# Patient Record
Sex: Female | Born: 1965 | Hispanic: Yes | Smoking: Never smoker
Health system: Southern US, Community
[De-identification: ages and names within clinical notes are randomized; demographics above are authoritative.]

## PROBLEM LIST (undated history)

## (undated) DIAGNOSIS — I10 Essential (primary) hypertension: Secondary | ICD-10-CM

## (undated) DIAGNOSIS — T8859XA Other complications of anesthesia, initial encounter: Secondary | ICD-10-CM

## (undated) DIAGNOSIS — R112 Nausea with vomiting, unspecified: Secondary | ICD-10-CM

## (undated) DIAGNOSIS — G4730 Sleep apnea, unspecified: Secondary | ICD-10-CM

## (undated) DIAGNOSIS — G8929 Other chronic pain: Secondary | ICD-10-CM

## (undated) DIAGNOSIS — R519 Headache, unspecified: Secondary | ICD-10-CM

## (undated) DIAGNOSIS — F32A Depression, unspecified: Secondary | ICD-10-CM

## (undated) DIAGNOSIS — R51 Headache: Secondary | ICD-10-CM

## (undated) DIAGNOSIS — Z9889 Other specified postprocedural states: Secondary | ICD-10-CM

## (undated) DIAGNOSIS — T4145XA Adverse effect of unspecified anesthetic, initial encounter: Secondary | ICD-10-CM

## (undated) DIAGNOSIS — M25559 Pain in unspecified hip: Secondary | ICD-10-CM

## (undated) DIAGNOSIS — F329 Major depressive disorder, single episode, unspecified: Secondary | ICD-10-CM

## (undated) DIAGNOSIS — Z87898 Personal history of other specified conditions: Secondary | ICD-10-CM

## (undated) HISTORY — PX: NASAL SINUS SURGERY: SHX719

---

## 2004-04-13 ENCOUNTER — Ambulatory Visit: Payer: Self-pay | Admitting: General Practice

## 2006-07-25 ENCOUNTER — Ambulatory Visit: Payer: Self-pay | Admitting: Family Medicine

## 2007-02-07 ENCOUNTER — Ambulatory Visit: Payer: Self-pay | Admitting: Family Medicine

## 2007-02-26 ENCOUNTER — Encounter: Payer: Self-pay | Admitting: Family Medicine

## 2007-03-01 ENCOUNTER — Encounter: Payer: Self-pay | Admitting: Family Medicine

## 2007-10-16 ENCOUNTER — Ambulatory Visit: Payer: Self-pay | Admitting: Family Medicine

## 2008-05-16 ENCOUNTER — Ambulatory Visit: Payer: Self-pay | Admitting: Internal Medicine

## 2008-07-02 ENCOUNTER — Inpatient Hospital Stay: Payer: Self-pay | Admitting: Internal Medicine

## 2008-07-22 ENCOUNTER — Ambulatory Visit: Payer: Self-pay | Admitting: Internal Medicine

## 2008-08-07 ENCOUNTER — Ambulatory Visit: Payer: Self-pay | Admitting: Internal Medicine

## 2010-07-14 ENCOUNTER — Ambulatory Visit: Payer: Self-pay | Admitting: Internal Medicine

## 2010-08-12 ENCOUNTER — Ambulatory Visit: Payer: Self-pay | Admitting: Internal Medicine

## 2010-11-22 ENCOUNTER — Emergency Department: Payer: Self-pay | Admitting: *Deleted

## 2010-11-25 ENCOUNTER — Emergency Department: Payer: Self-pay | Admitting: Internal Medicine

## 2011-09-28 ENCOUNTER — Ambulatory Visit: Payer: Self-pay | Admitting: Internal Medicine

## 2011-10-04 ENCOUNTER — Encounter: Payer: Self-pay | Admitting: Physician Assistant

## 2011-10-30 ENCOUNTER — Encounter: Payer: Self-pay | Admitting: Physician Assistant

## 2011-11-29 ENCOUNTER — Encounter: Payer: Self-pay | Admitting: Physician Assistant

## 2012-01-03 ENCOUNTER — Ambulatory Visit: Payer: Self-pay | Admitting: Internal Medicine

## 2012-04-24 ENCOUNTER — Encounter: Payer: Self-pay | Admitting: Orthopedic Surgery

## 2012-04-28 ENCOUNTER — Encounter: Payer: Self-pay | Admitting: Orthopedic Surgery

## 2012-05-29 ENCOUNTER — Encounter: Payer: Self-pay | Admitting: Orthopedic Surgery

## 2012-07-01 ENCOUNTER — Emergency Department: Payer: Self-pay | Admitting: Emergency Medicine

## 2012-07-03 ENCOUNTER — Emergency Department: Payer: Self-pay | Admitting: Emergency Medicine

## 2012-07-03 LAB — CBC
HCT: 41.5 % (ref 35.0–47.0)
HGB: 14.1 g/dL (ref 12.0–16.0)
MCHC: 33.9 g/dL (ref 32.0–36.0)
MCV: 86 fL (ref 80–100)
Platelet: 225 10*3/uL (ref 150–440)
RBC: 4.82 10*6/uL (ref 3.80–5.20)
WBC: 9.3 10*3/uL (ref 3.6–11.0)

## 2012-07-03 LAB — BASIC METABOLIC PANEL
Anion Gap: 5 — ABNORMAL LOW (ref 7–16)
Calcium, Total: 9 mg/dL (ref 8.5–10.1)
Chloride: 109 mmol/L — ABNORMAL HIGH (ref 98–107)
Co2: 24 mmol/L (ref 21–32)
Glucose: 79 mg/dL (ref 65–99)
Potassium: 3.9 mmol/L (ref 3.5–5.1)
Sodium: 138 mmol/L (ref 136–145)

## 2012-11-07 ENCOUNTER — Ambulatory Visit: Payer: Self-pay | Admitting: Internal Medicine

## 2012-12-27 ENCOUNTER — Encounter: Payer: Self-pay | Admitting: Orthopedic Surgery

## 2012-12-29 ENCOUNTER — Encounter: Payer: Self-pay | Admitting: Orthopedic Surgery

## 2013-01-26 ENCOUNTER — Emergency Department: Payer: Self-pay | Admitting: Emergency Medicine

## 2013-01-28 ENCOUNTER — Encounter: Payer: Self-pay | Admitting: Orthopedic Surgery

## 2013-06-04 DIAGNOSIS — G44221 Chronic tension-type headache, intractable: Secondary | ICD-10-CM | POA: Insufficient documentation

## 2013-06-04 DIAGNOSIS — E782 Mixed hyperlipidemia: Secondary | ICD-10-CM | POA: Insufficient documentation

## 2013-06-04 DIAGNOSIS — N83209 Unspecified ovarian cyst, unspecified side: Secondary | ICD-10-CM | POA: Insufficient documentation

## 2013-06-04 DIAGNOSIS — I10 Essential (primary) hypertension: Secondary | ICD-10-CM | POA: Insufficient documentation

## 2013-07-30 DIAGNOSIS — M169 Osteoarthritis of hip, unspecified: Secondary | ICD-10-CM | POA: Insufficient documentation

## 2013-08-09 DIAGNOSIS — D649 Anemia, unspecified: Secondary | ICD-10-CM | POA: Insufficient documentation

## 2013-11-08 ENCOUNTER — Ambulatory Visit: Payer: Self-pay | Admitting: Obstetrics and Gynecology

## 2013-11-13 DIAGNOSIS — F32A Depression, unspecified: Secondary | ICD-10-CM | POA: Insufficient documentation

## 2013-11-27 ENCOUNTER — Ambulatory Visit: Payer: Self-pay | Admitting: Neurology

## 2014-01-03 ENCOUNTER — Ambulatory Visit: Payer: Self-pay | Admitting: Orthopedic Surgery

## 2014-02-05 DIAGNOSIS — M48061 Spinal stenosis, lumbar region without neurogenic claudication: Secondary | ICD-10-CM | POA: Insufficient documentation

## 2014-02-05 DIAGNOSIS — M48062 Spinal stenosis, lumbar region with neurogenic claudication: Secondary | ICD-10-CM | POA: Insufficient documentation

## 2014-04-11 DIAGNOSIS — M5416 Radiculopathy, lumbar region: Secondary | ICD-10-CM | POA: Insufficient documentation

## 2014-07-09 ENCOUNTER — Emergency Department
Admission: EM | Admit: 2014-07-09 | Discharge: 2014-07-09 | Disposition: A | Discharge status: Home / Self Care | Payer: Commercial Managed Care - PPO | Attending: Emergency Medicine | Admitting: Emergency Medicine

## 2014-07-09 ENCOUNTER — Emergency Department: Payer: Commercial Managed Care - PPO

## 2014-07-09 DIAGNOSIS — Z3202 Encounter for pregnancy test, result negative: Secondary | ICD-10-CM | POA: Insufficient documentation

## 2014-07-09 DIAGNOSIS — N390 Urinary tract infection, site not specified: Secondary | ICD-10-CM | POA: Insufficient documentation

## 2014-07-09 DIAGNOSIS — Z79899 Other long term (current) drug therapy: Secondary | ICD-10-CM | POA: Diagnosis not present

## 2014-07-09 DIAGNOSIS — R1032 Left lower quadrant pain: Secondary | ICD-10-CM | POA: Diagnosis present

## 2014-07-09 DIAGNOSIS — R110 Nausea: Secondary | ICD-10-CM | POA: Diagnosis not present

## 2014-07-09 HISTORY — DX: Depression, unspecified: F32.A

## 2014-07-09 HISTORY — DX: Headache, unspecified: R51.9

## 2014-07-09 HISTORY — DX: Pain in unspecified hip: M25.559

## 2014-07-09 HISTORY — DX: Other chronic pain: G89.29

## 2014-07-09 HISTORY — DX: Headache: R51

## 2014-07-09 HISTORY — DX: Major depressive disorder, single episode, unspecified: F32.9

## 2014-07-09 LAB — BASIC METABOLIC PANEL
Anion gap: 7 (ref 5–15)
BUN: 12 mg/dL (ref 6–20)
CHLORIDE: 105 mmol/L (ref 101–111)
CO2: 27 mmol/L (ref 22–32)
CREATININE: 0.41 mg/dL — AB (ref 0.44–1.00)
Calcium: 8.9 mg/dL (ref 8.9–10.3)
GFR calc non Af Amer: >60 mL/min (ref >60)
GLUCOSE: 116 mg/dL — AB (ref 65–99)
Potassium: 3.6 mmol/L (ref 3.5–5.1)
Sodium: 139 mmol/L (ref 135–145)

## 2014-07-09 LAB — CBC WITH DIFFERENTIAL/PLATELET
Basophils Absolute: 0 10*3/uL (ref 0–0.1)
Basophils Relative: 0 %
EOS ABS: 0 10*3/uL (ref 0–0.7)
EOS PCT: 0 %
HEMATOCRIT: 42.4 % (ref 35.0–47.0)
Hemoglobin: 14.2 g/dL (ref 12.0–16.0)
LYMPHS PCT: 11 %
Lymphs Abs: 1.2 10*3/uL (ref 1.0–3.6)
MCH: 31.8 pg (ref 26.0–34.0)
MCHC: 33.5 g/dL (ref 32.0–36.0)
MCV: 95 fL (ref 80.0–100.0)
MONOS PCT: 7 %
Monocytes Absolute: 0.8 10*3/uL (ref 0.2–0.9)
Neutro Abs: 9.1 10*3/uL — ABNORMAL HIGH (ref 1.4–6.5)
Neutrophils Relative %: 82 %
PLATELETS: 210 10*3/uL (ref 150–440)
RBC: 4.47 MIL/uL (ref 3.80–5.20)
RDW: 13.1 % (ref 11.5–14.5)
WBC: 11.2 10*3/uL — ABNORMAL HIGH (ref 3.6–11.0)

## 2014-07-09 LAB — URINALYSIS COMPLETE WITH MICROSCOPIC (ARMC ONLY)
BACTERIA UA: NONE SEEN
BILIRUBIN URINE: NEGATIVE
GLUCOSE, UA: NEGATIVE mg/dL
Hgb urine dipstick: NEGATIVE
Ketones, ur: NEGATIVE mg/dL
NITRITE: NEGATIVE
Protein, ur: 30 mg/dL — AB
SPECIFIC GRAVITY, URINE: 1.024 (ref 1.005–1.030)
pH: 5 (ref 5.0–8.0)

## 2014-07-09 LAB — PREGNANCY, URINE: PREG TEST UR: NEGATIVE

## 2014-07-09 LAB — LIPASE, BLOOD: Lipase: 23 U/L (ref 22–51)

## 2014-07-09 MED ORDER — OXYCODONE-ACETAMINOPHEN 5-325 MG PO TABS: 2.0000 | ORAL_TABLET | Freq: Once | ORAL | Status: DC

## 2014-07-09 MED ORDER — CIPROFLOXACIN HCL 500 MG PO TABS: 500.0000 mg | ORAL_TABLET | Freq: Two times a day (BID) | ORAL | Status: AC

## 2014-07-09 MED ORDER — OXYCODONE-ACETAMINOPHEN 5-325 MG PO TABS
ORAL_TABLET | ORAL | Status: AC
  Filled 2014-07-09: qty 2

## 2014-07-09 MED ORDER — OXYCODONE-ACETAMINOPHEN 5-325 MG PO TABS: 1.0000 | ORAL_TABLET | Freq: Four times a day (QID) | ORAL | Status: DC | PRN

## 2014-07-09 NOTE — Discharge Instructions (Signed)
Dolor abdominal (Abdominal Pain) El dolor puede tener muchas causas. Normalmente la causa del dolor abdominal no es una enfermedad y Scientist, clinical (histocompatibility and immunogenetics)mejorar sin TEFL teachertratamiento. Frecuentemente puede controlarse y tratarse en casa. Su mdico le Medical sales representativerealizar un examen fsico y posiblemente solicite anlisis de sangre y radiografas para ayudar a Chief Strategy Officerdeterminar la gravedad de su dolor. Sin embargo, en IAC/InterActiveCorpmuchos casos, debe transcurrir ms tiempo antes de que se pueda Clinical research associateencontrar una causa evidente del dolor. Antes de llegar a ese punto, es posible que su mdico no sepa si necesita ms pruebas o un tratamiento ms profundo. INSTRUCCIONES PARA EL CUIDADO EN EL HOGAR  Est atento al dolor para ver si hay cambios. Las siguientes indicaciones ayudarn a Architectural technologistaliviar cualquier molestia que pueda sentir:  Stovallome solo medicamentos de venta libre o recetados, segn las indicaciones del mdico.  No tome laxantes a menos que se lo haya indicado su mdico.  Pruebe con Neomia Dearuna dieta lquida absoluta (caldo, t o agua) segn se lo indique su mdico. Introduzca gradualmente una dieta normal, segn su tolerancia. SOLICITE ATENCIN MDICA SI:  Tiene dolor abdominal sin explicacin.  Tiene dolor abdominal relacionado con nuseas o diarrea.  Tiene dolor cuando orina o defeca.  Experimenta dolor abdominal que lo despierta de noche.  Tiene dolor abdominal que empeora o mejora cuando come alimentos.  Tiene dolor abdominal que empeora cuando come alimentos grasosos.  Tiene fiebre. SOLICITE ATENCIN MDICA DE INMEDIATO SI:   El dolor no desaparece en un plazo mximo de 2horas.  No deja de (vomitar).  El Engineer, miningdolor se siente solo en partes del abdomen, como el lado derecho o la parte inferior izquierda del abdomen.  Evaca materia fecal sanguinolenta o negra, de aspecto alquitranado. ASEGRESE DE QUE:  Comprende estas instrucciones.  Controlar su afeccin.  Recibir ayuda de inmediato si no mejora o si empeora. Document Released: 02/14/2005  Document Revised: 02/19/2013 [facility] Patient Information 2015 EbonyExitCare, MarylandLLC. This information is not intended to replace advice given to you by your health care provider. Make sure you discuss any questions you have with your health care provider.  Infeccin urinaria  (Urinary Tract Infection)  La infeccin urinaria puede ocurrir en Corporate treasurercualquier lugar del tracto urinario. El tracto urinario es un sistema de drenaje del cuerpo por el que se eliminan los desechos y el exceso de [place]. El tracto urinario est formado por dos riones, dos urteres, la vejiga y Engineer, miningla uretra. Los riones son rganos que tienen forma de frijol. Cada rin tiene aproximadamente el tamao del puo. Estn situados debajo de las Castlewoodcostillas, uno a cada lado de la columna vertebral CAUSAS  La causa de la infeccin son los microbios, que son organismos microscpicos, que incluyen hongos, virus, y bacterias. Estos organismos son tan pequeos que slo pueden verse a travs del microscopio. Las bacterias son los microorganismos que ms comnmente causan infecciones urinarias.  SNTOMAS  Los sntomas pueden variar segn la edad y el sexo del paciente y por la ubicacin de la infeccin. Los sntomas en las mujeres jvenes incluyen la necesidad frecuente e intensa de orinar y una sensacin dolorosa de ardor en la vejiga o en la uretra durante la miccin. Las mujeres y los hombres mayores podrn sentir cansancio, temblores y debilidad y Futures tradersentir dolores musculares y Engineer, miningdolor abdominal. Si tiene Pie Townfiebre, puede significar que la infeccin est en los riones. Otros sntomas son dolor en la espalda o en los lados debajo de las [place] Hillcostillas, nuseas y vmitos.  DIAGNSTICO  Para diagnosticar una infeccin urinaria, el mdico Chief Executive Officerle preguntar acerca de  sus sntomas. Genuine Partsambin le solicitar una Conversemuestra de Comor[place]orina. La muestra de orina se analiza para Engineer, manufacturingdetectar bacterias y glbulos blancos de Risk managerla sangre. Los glbulos blancos se forman en el organismo para ayudar a Artistcombatir  las infecciones.  TRATAMIENTO  Por lo general, las infecciones urinarias pueden tratarse con medicamentos. Debido a que la Harley-Davidsonmayora de las infecciones son causadas por bacterias, por lo general pueden tratarse con antibiticos. La eleccin del antibitico y la duracin del tratamiento depender de sus sntomas y el tipo de bacteria causante de la infeccin.  INSTRUCCIONES PARA EL CUIDADO EN EL HOGAR   Si le recetaron antibiticos, tmelos exactamente como su mdico le indique. Termine el medicamento aunque se sienta mejor despus de haber tomado slo algunos.  Beba gran cantidad de lquido para mantener la orina de tono claro o color amarillo plido.  Evite la cafena, el t y las [address] Hospital Placebebidas gaseosas. Estas sustancias irritan la vejiga.  Vaciar la vejiga con frecuencia. Evite retener la orina durante largos perodos.  Vace la vejiga antes y despus de Management consultanttener relaciones sexuales.  Despus de mover el intestino, las mujeres deben higienizarse la regin perineal desde adelante hacia atrs. Use slo un papel tissue por vez. SOLICITE ATENCIN MDICA SI:   Siente dolor en la espalda.  Le sube la fiebre.  Los sntomas no mejoran luego de [address] das. SOLICITE ATENCIN MDICA DE INMEDIATO SI:   Siente dolor intenso en la espalda o en la zona inferior del abdomen.  Comienza a sentir escalofros.  Tiene nuseas o vmitos.  Tiene una sensacin continua de quemazn o molestias al ConocoPhillipsorinar. ASEGRESE DE QUE:   Comprende estas instrucciones.  Controlar su enfermedad.  Solicitar ayuda de inmediato si no mejora o empeora. Document Released: 11/24/2004 Document Revised: 11/09/2011 [facility]Exit[facility] Patient Information 2015 LutherExitCare, MarylandLLC. This information is not intended to replace advice given to you by your health care provider. Make sure you discuss any questions you have with your health care provider.

## 2014-07-09 NOTE — ED Provider Notes (Signed)
[facility] Emergency Department Provider Note    Time seen: 1640  I have reviewed the triage vital signs and the nursing notes.   HISTORY  Chief Complaint Abdominal Pain    HPI Makayla SpeedLeticia Matias Baker is a 49 y.o. female presents to ER for a pinching and dull left lower quadrant abdominal pain since yesterday. his medic make it better or worse, she has some nausea but has been going to bathroom normally does not have any dysuria. Denies any history of same. Pain is currently mild     Past Medical History  Diagnosis Date  . Hip pain, chronic   . Headache   . Depression     There are no active problems to display for this patient.   History reviewed. No pertinent past surgical history.  Current Outpatient Rx  Name  Route  Sig  Dispense  Refill  . Ascorbic Acid (VITAMIN C) 1000 MG tablet   Oral   Take 1,000 mg by mouth daily.         . ergocalciferol (VITAMIN D2) 50000 UNITS capsule   Oral   Take 50,000 Units by mouth once a week.         . ferrous fumarate (HEMOCYTE - 106 MG FE) 325 (106 FE) MG TABS tablet   Oral   Take 1 tablet by mouth.           Allergies Review of patient's allergies indicates no known allergies.  No family history on file.  Social History History  Substance Use Topics  . Smoking status: Never Smoker   . Smokeless tobacco: Not on file  . Alcohol Use: No    Review of Systems Constitutional: Negative for fever. Eyes: Negative for visual changes. ENT: Negative for sore throat. Cardiovascular: Negative for chest pain. Respiratory: Negative for shortness of breath. Gastrointestinal: Negative for abdominal pain, vomiting and diarrhea. Genitourinary: Negative for dysuria. Musculoskeletal: Negative for back pain. Skin: Negative for rash. Neurological: Negative for headaches, focal weakness or numbness.  10-point ROS otherwise negative.  ____________________________________________   PHYSICAL  EXAM:  VITAL SIGNS: ED Triage Vitals  Enc Vitals Group     BP 07/09/14 1405 136/72 mmHg     Pulse Rate 07/09/14 1405 92     Resp 07/09/14 1405 20     Temp 07/09/14 1405 98.5 F (36.9 C)     Temp Source 07/09/14 1405 Oral     SpO2 07/09/14 1405 98 %     Weight --      Height --      Head Cir --      Peak Flow --      Pain Score 07/09/14 1627 8     Pain Loc --      Pain Edu? --      Excl. in GC? --     Constitutional: Alert and oriented. Well appearing and in no distress. Eyes: Conjunctivae are normal. PERRL. Normal extraocular movements. ENT   Head: Normocephalic and atraumatic.   Nose: No congestion/rhinnorhea.   Mouth/Throat: Mucous membranes are moist.   Neck: No stridor. Hematological/Lymphatic/Immunilogical: No cervical lymphadenopathy. Cardiovascular: Normal rate, regular rhythm. Normal and symmetric distal pulses are present in all extremities. No murmurs, rubs, or gallops. Respiratory: Normal respiratory effort without tachypnea nor retractions. Breath sounds are clear and equal bilaterally. No wheezes/rales/rhonchi. Gastrointestinal: Soft and nontender. No distention. No abdominal bruits. There is no CVA tenderness. Musculoskeletal: Nontender with normal range of motion in all extremities. No joint effusions.  No lower extremity tenderness nor edema. Neurologic:  Normal speech and language. No gross focal neurologic deficits are appreciated. Speech is normal. No gait instability. Skin:  Skin is warm, dry and intact. No rash noted. Psychiatric: Mood and affect are normal. Speech and behavior are normal. Patient exhibits appropriate insight and judgment.  ____________________________________________    LABS (pertinent positives/negatives)  Labs Reviewed  BASIC METABOLIC PANEL - Abnormal; Notable for the following:    Glucose, Bld 116 (*)    Creatinine, Ser 0.41 (*)    All other components within normal limits  CBC WITH DIFFERENTIAL/PLATELET -  Abnormal; Notable for the following:    WBC 11.2 (*)    Neutro Abs 9.1 (*)    All other components within normal limits  URINALYSIS COMPLETEWITH MICROSCOPIC ([facility])  - Abnormal; Notable for the following:    Color, Urine AMBER (*)    APPearance HAZY (*)    Protein, ur 30 (*)    Leukocytes, UA TRACE (*)    Squamous Epithelial / LPF 0-5 (*)    All other components within normal limits  LIPASE, BLOOD  PREGNANCY, URINE     ____________________________________________    RADIOLOGY  ED and pelvis with right intrarenal stone but normal on the left.  ____________________________________________    ED COURSE  Pertinent labs & imaging results that were available during my care of the patient were reviewed by me and considered in my medical decision making (see chart for details).  We'll get basic labs and CT and reevaluate.  FINAL ASSESSMENT AND PLAN  Left-sided abdominal pain and cystitis  Plan: Patient discharged with antibiotics and pain medicine and close follow-up with her doctor.    Emily FilbertWilliams, [name], MD   [name], MD 07/09/14 [phone]

## 2014-07-09 NOTE — ED Notes (Signed)
LLQ pain and nausea since yesterday

## 2014-07-09 NOTE — ED Notes (Signed)
Pt complains of LLQ.  PT alert and oriented. Pt requesting food.

## 2014-07-15 ENCOUNTER — Other Ambulatory Visit: Payer: Self-pay | Admitting: Internal Medicine

## 2014-07-15 DIAGNOSIS — R102 Pelvic and perineal pain: Secondary | ICD-10-CM

## 2014-07-18 ENCOUNTER — Other Ambulatory Visit: Payer: Self-pay | Admitting: Internal Medicine

## 2014-07-18 DIAGNOSIS — R102 Pelvic and perineal pain: Secondary | ICD-10-CM

## 2014-07-21 ENCOUNTER — Ambulatory Visit
Admission: RE | Admit: 2014-07-21 | Discharge: 2014-07-21 | Disposition: A | Discharge status: Home / Self Care | Payer: Commercial Managed Care - PPO | Source: Ambulatory Visit | Attending: Internal Medicine | Admitting: Internal Medicine

## 2014-07-21 DIAGNOSIS — R102 Pelvic and perineal pain: Secondary | ICD-10-CM | POA: Diagnosis present

## 2014-11-26 ENCOUNTER — Other Ambulatory Visit: Payer: Self-pay | Admitting: Internal Medicine

## 2014-11-26 DIAGNOSIS — Z1231 Encounter for screening mammogram for malignant neoplasm of breast: Secondary | ICD-10-CM

## 2014-12-09 ENCOUNTER — Ambulatory Visit
Admission: RE | Admit: 2014-12-09 | Discharge: 2014-12-09 | Disposition: A | Discharge status: Home / Self Care | Payer: Commercial Managed Care - PPO | Source: Ambulatory Visit | Attending: Internal Medicine | Admitting: Internal Medicine

## 2014-12-09 DIAGNOSIS — Z1231 Encounter for screening mammogram for malignant neoplasm of breast: Secondary | ICD-10-CM | POA: Diagnosis present

## 2015-09-11 ENCOUNTER — Other Ambulatory Visit: Payer: Self-pay | Admitting: Obstetrics and Gynecology

## 2015-09-11 DIAGNOSIS — Z1231 Encounter for screening mammogram for malignant neoplasm of breast: Secondary | ICD-10-CM

## 2015-09-28 ENCOUNTER — Ambulatory Visit: Payer: Commercial Managed Care - PPO | Attending: Obstetrics and Gynecology

## 2015-10-26 DIAGNOSIS — R195 Other fecal abnormalities: Secondary | ICD-10-CM | POA: Insufficient documentation

## 2015-12-10 ENCOUNTER — Other Ambulatory Visit: Payer: Self-pay | Admitting: Obstetrics and Gynecology

## 2015-12-10 ENCOUNTER — Ambulatory Visit
Admission: RE | Admit: 2015-12-10 | Discharge: 2015-12-10 | Disposition: A | Discharge status: Home / Self Care | Payer: Commercial Managed Care - PPO | Source: Ambulatory Visit | Attending: Obstetrics and Gynecology | Admitting: Obstetrics and Gynecology

## 2015-12-10 DIAGNOSIS — Z1231 Encounter for screening mammogram for malignant neoplasm of breast: Secondary | ICD-10-CM | POA: Insufficient documentation

## 2016-05-21 IMAGING — MR MRI HEAD WITHOUT CONTRAST
10 series · 41 of 48 positions shown · non-contrast
Comparison: Mandible radiographs 01/26/2013. Head CT without
contrast 01/03/2012.

CLINICAL DATA: 48-year-old female with headache on the right side
of the face and jaw. Initial encounter. Dislocated jaw in December 2012.

EXAM:
MRI HEAD WITHOUT CONTRAST
TECHNIQUE: Multiplanar, multiecho pulse sequences of the brain and surrounding
structures were obtained without intravenous contrast.

[Series 3: T1 · sagittal · 5.0mm · 0.45mm/px · 3 of 27 slices shown (1 of 2)]
[im 1/27]
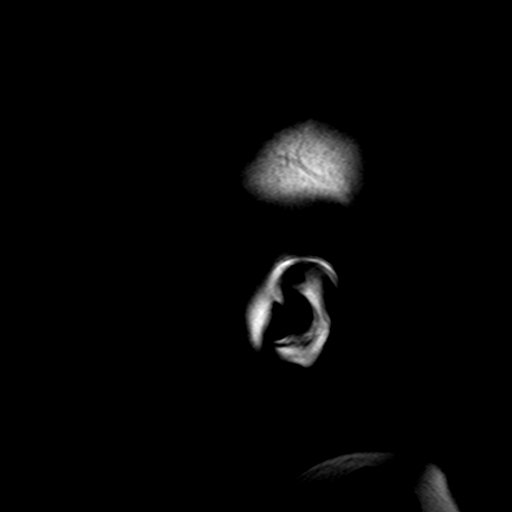
[im 14/27]
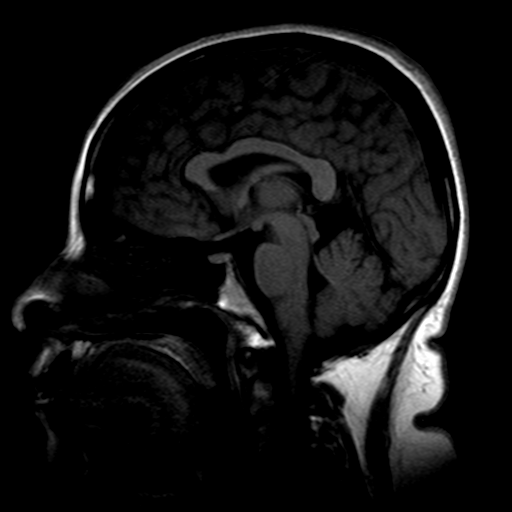
[im 27/27]
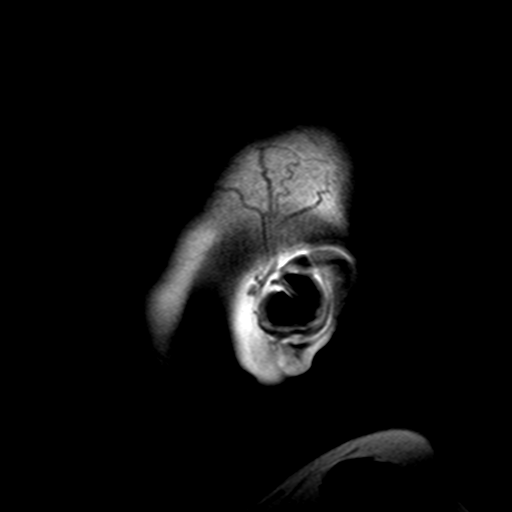

[Series 5: DWI · axial · 5.0mm · 1.80mm/px · z∈[-52,+111]mm · 4 of 26 slices shown (1 of 4)]
[im 1/26]
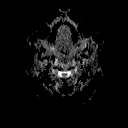
[im 9/26]
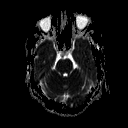
[im 17/26]
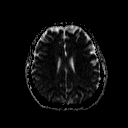
[im 26/26]
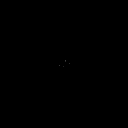

[Series 7: DWI · coronal · 5.0mm · 1.80mm/px · 6 of 37 slices shown (2 of 4)]
[im 1/37]
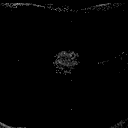
[im 8/37]
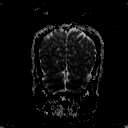
[im 15/37]
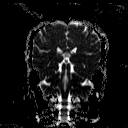
[im 22/37]
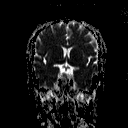
[im 29/37]
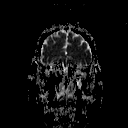
[im 37/37]
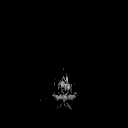

[Series 8: T2 · axial · 5.0mm · 0.45mm/px · z∈[-53,+110]mm · 4 of 26 slices shown (1 of 3)]
[im 1/26]
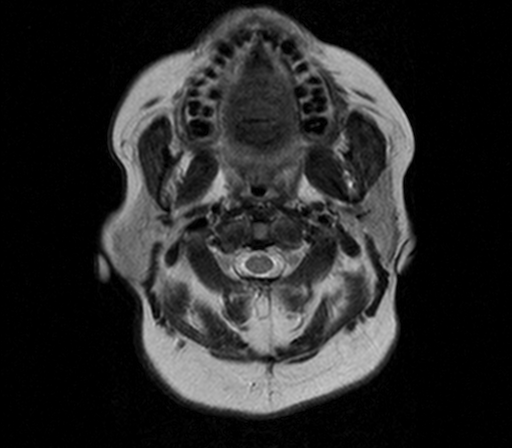
[im 9/26]
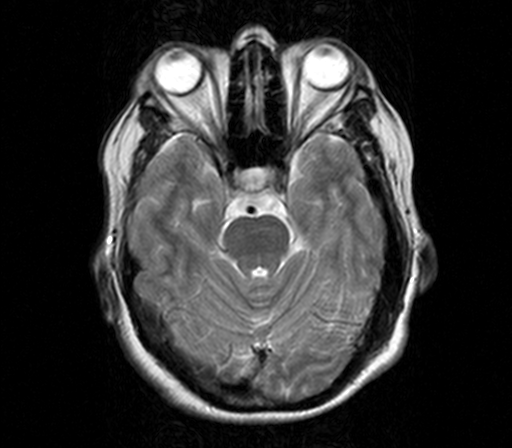
[im 17/26]
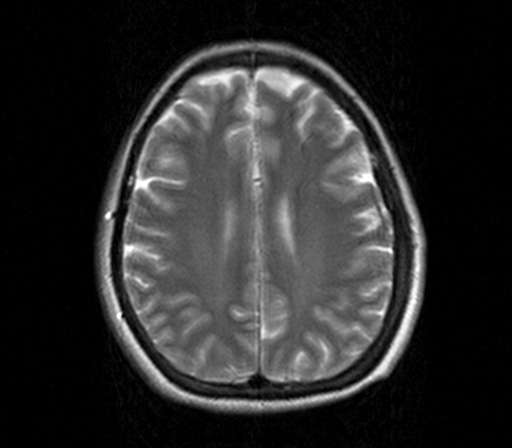
[im 26/26]
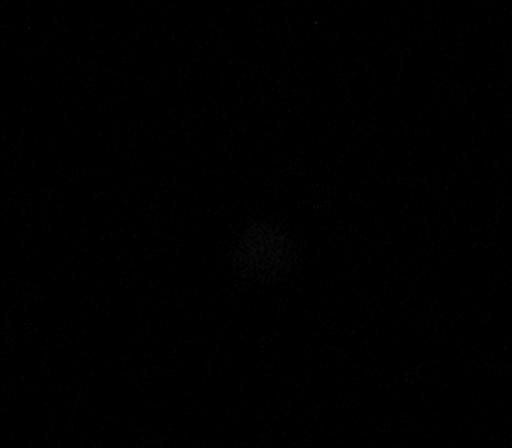

[Series 9: FLAIR · axial · 5.0mm · 0.90mm/px · z∈[-52,+110]mm · 4 of 26 slices shown]
[im 1/26]
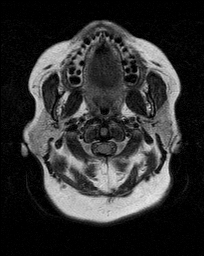
[im 9/26]
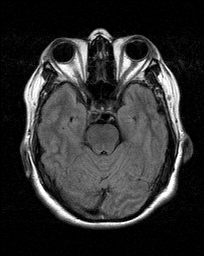
[im 17/26]
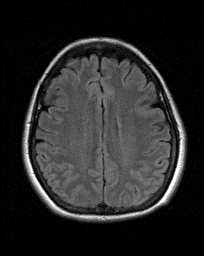
[im 26/26]
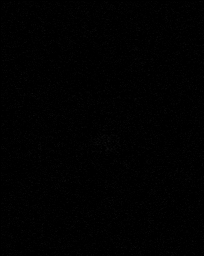

[Series 10: DWI · axial · 5.0mm · 1.80mm/px · z∈[-52,+104]mm · 4 of 25 slices shown (3 of 4)]
[im 1/25]
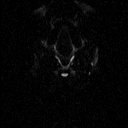
[im 9/25]
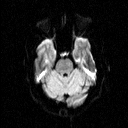
[im 17/25]
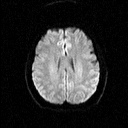
[im 25/25]
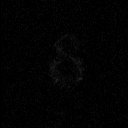

[Series 11: DWI · coronal · 5.0mm · 1.80mm/px · 5 of 36 slices shown (4 of 4)]
[im 1/36]
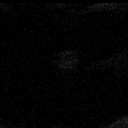
[im 9/36]
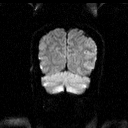
[im 18/36]
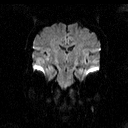
[im 27/36]
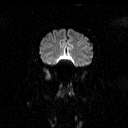
[im 36/36]
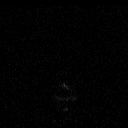

[Series 12: T2 · axial · 5.0mm · 0.45mm/px · z∈[-52,+110]mm · 4 of 26 slices shown (2 of 3)]
[im 1/26]
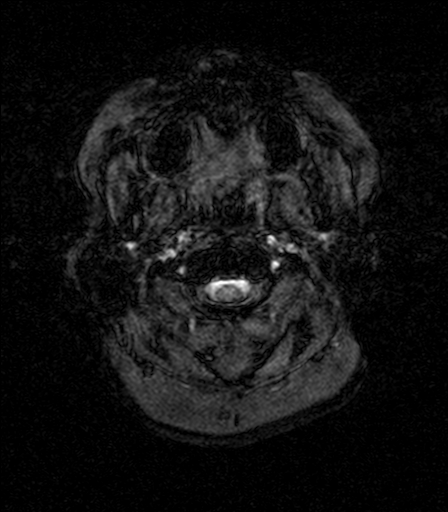
[im 9/26]
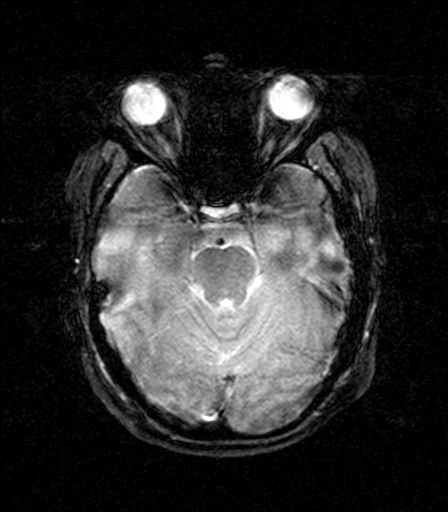
[im 17/26]
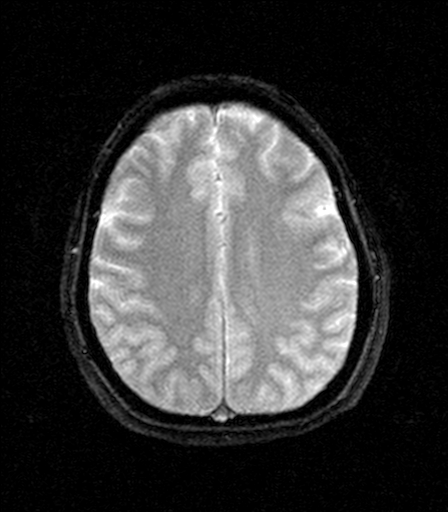
[im 26/26]
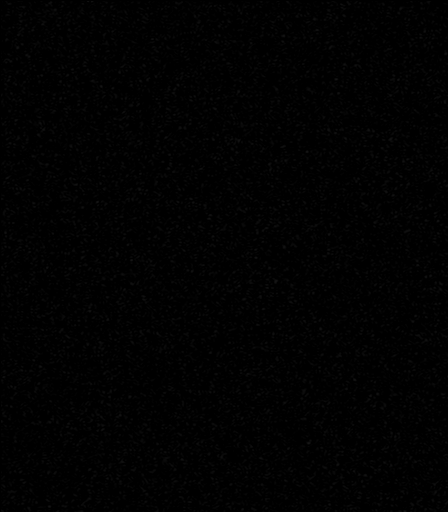

[Series 13: T1 · axial · 3.0mm · 0.45mm/px · 1 of 56 slices shown (2 of 2)]
[im 1/56]
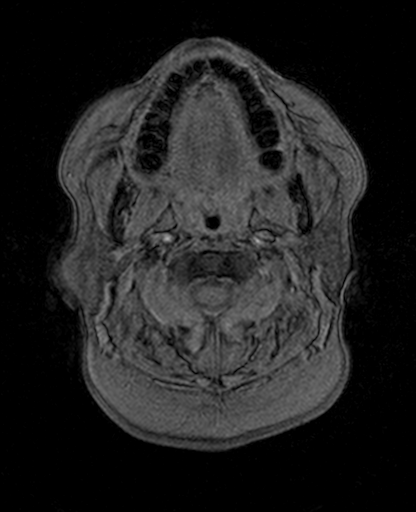

[Series 14: T2 · coronal · 5.0mm · 0.45mm/px · 6 of 37 slices shown (3 of 3)]
[im 1/37]
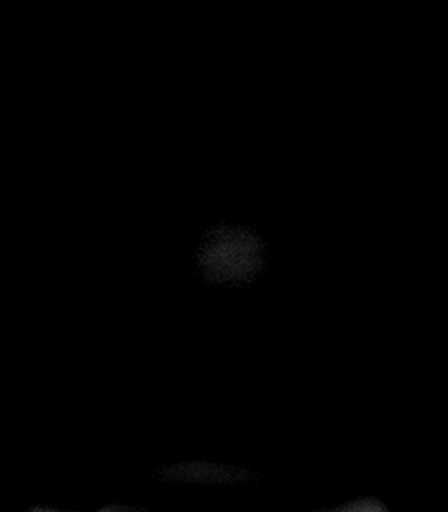
[im 8/37]
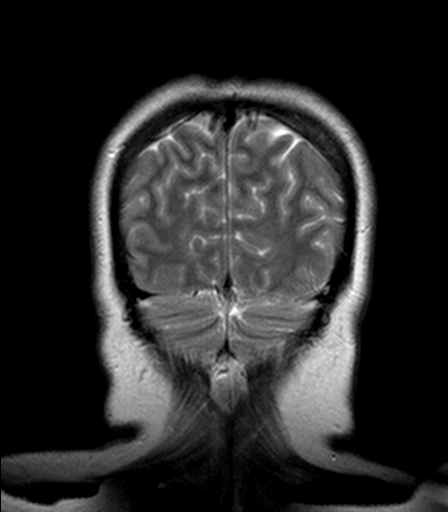
[im 15/37]
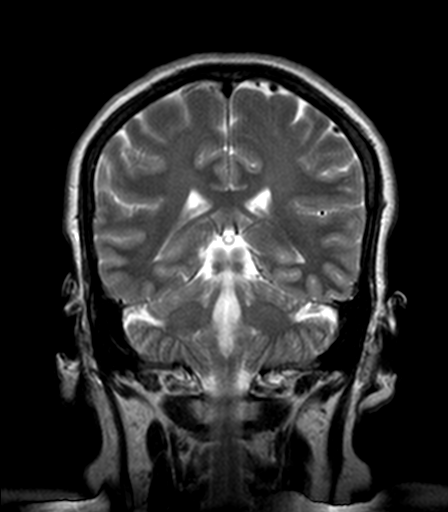
[im 22/37]
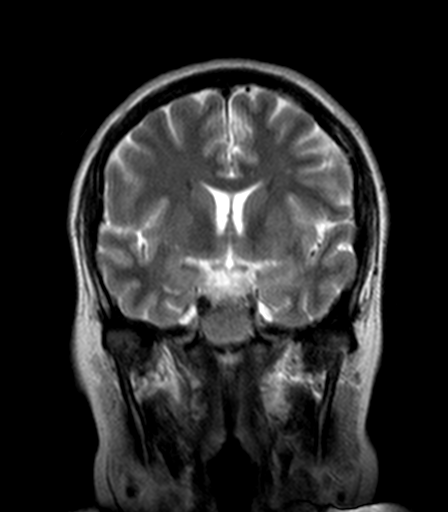
[im 29/37]
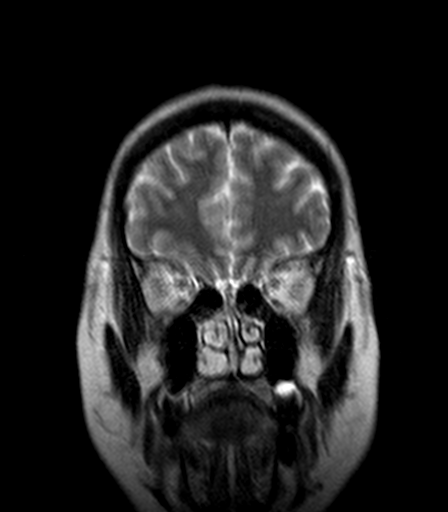
[im 37/37]
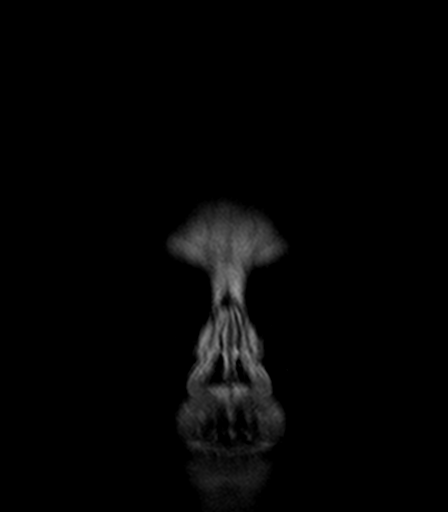

[41 of 48 positions shown; findings below may reference images not displayed]

FINDINGS: Cerebral volume is normal. No restricted diffusion to suggest acute
infarction. No midline shift, mass effect, evidence of mass lesion,
ventriculomegaly, extra-axial collection or acute intracranial
hemorrhage. Cervicomedullary junction and pituitary are within
normal limits. Negative visualized cervical spine. Major
intracranial vascular flow voids are preserved. Gray and white
matter signal is within normal limits throughout the brain.

Normal non contrast appearance of the cavernous sinus. Visualized
orbit soft tissues are within normal limits. Visualized paranasal
sinuses and mastoids are clear (aside from minimal left maxillary
alveolar recess mucosal thickening). No skullbase mass. Non contrast
V2 and V3 trunks appear within normal limits. Visible internal
auditory structures appear normal. Face and scalp soft tissues are
within normal limits. Visualized bone marrow signal is within normal
limits. Both TMJs appear normally located.
IMPRESSION: Normal non contrast MRI appearance of the brain.

No finding to explain right face and jaw pain; the right TMJ is
normally located, and skullbase and face soft tissues appear
unremarkable.

## 2016-09-12 ENCOUNTER — Other Ambulatory Visit: Payer: Self-pay | Admitting: Obstetrics and Gynecology

## 2016-09-28 ENCOUNTER — Other Ambulatory Visit: Payer: Self-pay | Admitting: Obstetrics and Gynecology

## 2016-09-28 DIAGNOSIS — Z1239 Encounter for other screening for malignant neoplasm of breast: Secondary | ICD-10-CM

## 2016-09-29 ENCOUNTER — Ambulatory Visit: Payer: Commercial Managed Care - PPO | Attending: Orthopedic Surgery

## 2016-09-29 DIAGNOSIS — G4733 Obstructive sleep apnea (adult) (pediatric): Secondary | ICD-10-CM | POA: Insufficient documentation

## 2016-09-29 DIAGNOSIS — G4761 Periodic limb movement disorder: Secondary | ICD-10-CM | POA: Insufficient documentation

## 2016-09-29 DIAGNOSIS — R400 Somnolence: Secondary | ICD-10-CM | POA: Insufficient documentation

## 2016-09-29 DIAGNOSIS — R0683 Snoring: Secondary | ICD-10-CM | POA: Insufficient documentation

## 2016-10-14 ENCOUNTER — Ambulatory Visit: Payer: Commercial Managed Care - PPO | Attending: Neurology

## 2016-10-14 DIAGNOSIS — G4733 Obstructive sleep apnea (adult) (pediatric): Secondary | ICD-10-CM | POA: Diagnosis not present

## 2016-12-12 ENCOUNTER — Ambulatory Visit
Admission: RE | Admit: 2016-12-12 | Discharge: 2016-12-12 | Disposition: A | Discharge status: Home / Self Care | Payer: Commercial Managed Care - PPO | Source: Ambulatory Visit | Attending: Obstetrics and Gynecology | Admitting: Obstetrics and Gynecology

## 2016-12-12 DIAGNOSIS — Z1231 Encounter for screening mammogram for malignant neoplasm of breast: Secondary | ICD-10-CM | POA: Diagnosis not present

## 2016-12-12 DIAGNOSIS — Z1239 Encounter for other screening for malignant neoplasm of breast: Secondary | ICD-10-CM

## 2017-03-28 ENCOUNTER — Encounter: Payer: Self-pay | Admitting: *Deleted

## 2017-04-04 ENCOUNTER — Encounter: Payer: Self-pay | Admitting: *Deleted

## 2017-04-04 ENCOUNTER — Ambulatory Visit: Payer: Commercial Managed Care - PPO | Admitting: Certified Registered"

## 2017-04-04 ENCOUNTER — Ambulatory Visit
Admission: RE | Admit: 2017-04-04 | Discharge: 2017-04-04 | Disposition: A | Discharge status: Home / Self Care | Payer: Commercial Managed Care - PPO | Source: Ambulatory Visit | Attending: Ophthalmology | Admitting: Ophthalmology

## 2017-04-04 ENCOUNTER — Encounter
Admission: RE | Disposition: A | Discharge status: Home / Self Care | Payer: Self-pay | Source: Ambulatory Visit | Attending: Ophthalmology

## 2017-04-04 ENCOUNTER — Other Ambulatory Visit: Payer: Self-pay

## 2017-04-04 DIAGNOSIS — Z888 Allergy status to other drugs, medicaments and biological substances status: Secondary | ICD-10-CM | POA: Insufficient documentation

## 2017-04-04 DIAGNOSIS — M1990 Unspecified osteoarthritis, unspecified site: Secondary | ICD-10-CM | POA: Insufficient documentation

## 2017-04-04 DIAGNOSIS — Z885 Allergy status to narcotic agent status: Secondary | ICD-10-CM | POA: Diagnosis not present

## 2017-04-04 DIAGNOSIS — G4730 Sleep apnea, unspecified: Secondary | ICD-10-CM | POA: Diagnosis not present

## 2017-04-04 DIAGNOSIS — H2511 Age-related nuclear cataract, right eye: Secondary | ICD-10-CM | POA: Diagnosis not present

## 2017-04-04 DIAGNOSIS — Z79899 Other long term (current) drug therapy: Secondary | ICD-10-CM | POA: Diagnosis not present

## 2017-04-04 HISTORY — DX: Sleep apnea, unspecified: G47.30

## 2017-04-04 HISTORY — DX: Adverse effect of unspecified anesthetic, initial encounter: T41.45XA

## 2017-04-04 HISTORY — DX: Other complications of anesthesia, initial encounter: T88.59XA

## 2017-04-04 HISTORY — DX: Nausea with vomiting, unspecified: R11.2

## 2017-04-04 HISTORY — DX: Personal history of other specified conditions: Z87.898

## 2017-04-04 HISTORY — PX: CATARACT EXTRACTION W/PHACO: SHX586

## 2017-04-04 HISTORY — DX: Essential (primary) hypertension: I10

## 2017-04-04 HISTORY — DX: Other specified postprocedural states: Z98.890

## 2017-04-04 HISTORY — DX: Other specified postprocedural states: R11.2

## 2017-04-04 SURGERY — PHACOEMULSIFICATION, CATARACT, WITH IOL INSERTION
Anesthesia: Monitor Anesthesia Care | Site: Eye | Laterality: Right | Wound class: Clean

## 2017-04-04 MED ORDER — NA CHONDROIT SULF-NA HYALURON 40-17 MG/ML IO SOLN
INTRAOCULAR | Status: AC
  Filled 2017-04-04: qty 1

## 2017-04-04 MED ORDER — EPINEPHRINE PF 1 MG/ML IJ SOLN
INTRAMUSCULAR | Status: AC
  Filled 2017-04-04: qty 2

## 2017-04-04 MED ORDER — ARMC OPHTHALMIC DILATING DROPS
OPHTHALMIC | Status: AC
  Administered 2017-04-04: 1 via OPHTHALMIC
  Filled 2017-04-04: qty 0.4

## 2017-04-04 MED ORDER — SODIUM CHLORIDE 0.9 % IV SOLN
INTRAVENOUS | Status: DC
  Administered 2017-04-04: 09:00:00 via INTRAVENOUS

## 2017-04-04 MED ORDER — BSS IO SOLN
INTRAOCULAR | Status: DC | PRN
  Administered 2017-04-04: 4 mL via OPHTHALMIC

## 2017-04-04 MED ORDER — MIDAZOLAM HCL 2 MG/2ML IJ SOLN
INTRAMUSCULAR | Status: AC
  Filled 2017-04-04: qty 2

## 2017-04-04 MED ORDER — NA CHONDROIT SULF-NA HYALURON 40-17 MG/ML IO SOLN
INTRAOCULAR | Status: DC | PRN
  Administered 2017-04-04: 1 mL via INTRAOCULAR

## 2017-04-04 MED ORDER — MOXIFLOXACIN HCL 0.5 % OP SOLN: 1.0000 [drp] | OPHTHALMIC | Status: DC | PRN

## 2017-04-04 MED ORDER — POVIDONE-IODINE 5 % OP SOLN
OPHTHALMIC | Status: DC | PRN
  Administered 2017-04-04: 1 via OPHTHALMIC

## 2017-04-04 MED ORDER — LIDOCAINE HCL (PF) 4 % IJ SOLN
INTRAMUSCULAR | Status: AC
  Filled 2017-04-04: qty 5

## 2017-04-04 MED ORDER — EPINEPHRINE PF 1 MG/ML IJ SOLN
INTRAOCULAR | Status: DC | PRN
  Administered 2017-04-04: 09:00:00 via OPHTHALMIC

## 2017-04-04 MED ORDER — FENTANYL CITRATE (PF) 100 MCG/2ML IJ SOLN
INTRAMUSCULAR | Status: DC | PRN
  Administered 2017-04-04: 50 ug via INTRAVENOUS

## 2017-04-04 MED ORDER — POVIDONE-IODINE 5 % OP SOLN
OPHTHALMIC | Status: AC
  Filled 2017-04-04: qty 30

## 2017-04-04 MED ORDER — MOXIFLOXACIN HCL 0.5 % OP SOLN
OPHTHALMIC | Status: AC
  Filled 2017-04-04: qty 3

## 2017-04-04 MED ORDER — MOXIFLOXACIN HCL 0.5 % OP SOLN
OPHTHALMIC | Status: DC | PRN
  Administered 2017-04-04: 0.2 mL via OPHTHALMIC

## 2017-04-04 MED ORDER — ARMC OPHTHALMIC DILATING DROPS
1.0000 "application " | OPHTHALMIC | Status: AC
  Administered 2017-04-04 (×3): 1 via OPHTHALMIC

## 2017-04-04 MED ORDER — MIDAZOLAM HCL 2 MG/2ML IJ SOLN
INTRAMUSCULAR | Status: DC | PRN
  Administered 2017-04-04: 1 mg via INTRAVENOUS

## 2017-04-04 MED ORDER — CARBACHOL 0.01 % IO SOLN
INTRAOCULAR | Status: DC | PRN
  Administered 2017-04-04: 0.5 mL via INTRAOCULAR

## 2017-04-04 MED ORDER — FENTANYL CITRATE (PF) 100 MCG/2ML IJ SOLN
INTRAMUSCULAR | Status: AC
  Filled 2017-04-04: qty 2

## 2017-04-04 SURGICAL SUPPLY — 18 items
GLOVE BIO SURGEON STRL SZ8 (GLOVE) ×3 IMPLANT
GLOVE BIOGEL M 6.5 STRL (GLOVE) ×3 IMPLANT
GLOVE SURG LX 8.0 MICRO (GLOVE) ×2
GLOVE SURG LX STRL 8.0 MICRO (GLOVE) ×1 IMPLANT
GOWN STRL REUS W/ TWL LRG LVL3 (GOWN DISPOSABLE) ×2 IMPLANT
GOWN STRL REUS W/TWL LRG LVL3 (GOWN DISPOSABLE) ×4
LABEL CATARACT MEDS ST (LABEL) ×3 IMPLANT
LENS IOL ACRSF IQ TRC 5 31.0 ×1 IMPLANT
LENS IOL ACRYSOF IQ TORIC 31.0 ×2 IMPLANT
LENS IOL IQ TORIC 5 31.0 ×1 IMPLANT
PACK CATARACT (MISCELLANEOUS) ×3 IMPLANT
PACK CATARACT BRASINGTON LX (MISCELLANEOUS) ×3 IMPLANT
PACK EYE AFTER SURG (MISCELLANEOUS) ×3 IMPLANT
SOL BSS BAG (MISCELLANEOUS) ×3
SOLUTION BSS BAG (MISCELLANEOUS) ×1 IMPLANT
SYR 5ML LL (SYRINGE) ×3 IMPLANT
WATER STERILE IRR 250ML POUR (IV SOLUTION) ×3 IMPLANT
WIPE NON LINTING 3.25X3.25 (MISCELLANEOUS) ×3 IMPLANT

## 2017-04-04 NOTE — H&P (Signed)
All labs reviewed. Abnormal studies sent to patients PCP when indicated.  Previous H&P reviewed, patient examined, there are NO CHANGES.  Makayla Grimley Porfilio2/5/20199:06 AM

## 2017-04-04 NOTE — Anesthesia Postprocedure Evaluation (Signed)
Anesthesia Post Note  Patient: Makayla Baker  Procedure(s) Performed: CATARACT EXTRACTION PHACO AND INTRAOCULAR LENS PLACEMENT ([place]) (Right Eye)  Patient location during evaluation: PACU Anesthesia Type: MAC Level of consciousness: awake, awake and alert and oriented Pain management: pain level controlled Vital Signs Assessment: post-procedure vital signs reviewed and stable Respiratory status: spontaneous breathing, nonlabored ventilation and respiratory function stable Cardiovascular status: stable Anesthetic complications: no     Last Vitals:  Vitals:   04/04/17 0933 04/04/17 0935  BP: 138/67 138/67  Pulse: 64 64  Resp:  13  Temp: (!) 36.4 C   SpO2: 99% 99%    Last Pain:  Vitals:   04/04/17 0826  TempSrc: Temporal                 Lance Muss

## 2017-04-04 NOTE — Op Note (Signed)
PREOPERATIVE DIAGNOSIS:  Nuclear sclerotic cataract of the right eye.   POSTOPERATIVE DIAGNOSIS:  Nuclear sclerotic cataract of the right eye.   OPERATIVE PROCEDURE: Procedure(s): CATARACT EXTRACTION PHACO AND INTRAOCULAR LENS PLACEMENT (IOC)   SURGEON:  Galen ManilaWilliam Ilayda Toda, MD.   ANESTHESIA: 1.      Managed anesthesia care. 2.     0.641ml of Shugarcaine was instilled following the paracentesis  Anesthesiologist: Yves Dillarroll, Paul, MD CRNA: Casey BurkittHoang, Thuy, CRNA; Junious SilkNoles, Mark, CRNA  COMPLICATIONS:  None.   TECHNIQUE:   Stop and chop    DESCRIPTION OF PROCEDURE:  The patient was examined and consented in the preoperative holding area where the aforementioned topical anesthesia was applied to the right eye.  The patient was brought back to the Operating Room where he was sat upright on the gurney and given a target to fixate upon while the eye was marked at the 3:00 and 9:00 position.  The patient was then reclined on the operating table.  The eye was prepped and draped in the usual sterile ophthalmic fashion and a lid speculum was placed. A paracentesis was created with the side port blade and the anterior chamber was filled with viscoelastic. A near clear corneal incision was performed with the steel keratome. A continuous curvilinear capsulorrhexis was performed with a cystotome followed by the capsulorrhexis forceps. Hydrodissection and hydrodelineation were carried out with BSS on a blunt cannula. The lens was removed in a stop and chop technique and the remaining cortical material was removed with the irrigation-aspiration handpiece. The eye was inflated with viscoelastic and the ZCT  lens  was placed in the eye and rotated to within a few degrees of the predetermined orientation.  The remaining viscoelastic was removed from the eye.  The Sinskey hook was used to rotate the toric lens into its final resting place at 065 degrees. . The eye was inflated to a physiologic pressure and found to be watertight.  0.721ml of Vigamox was placed in the anterior chamber.  The eye was dressed with Vigamox. The patient was given protective glasses to wear throughout the day and a shield with which to sleep tonight. The patient was also given drops with which to begin a drop regimen today and will follow-up with me in one day. Implant Name Type Inv. Item Serial No. Manufacturer Lot No. LRB No. Used  LENS IOL TORIC 31.0 - [id number]  LENS IOL TORIC 31.0 [id number] ALCON  Right 1   Procedure(s) with comments: CATARACT EXTRACTION PHACO AND INTRAOCULAR LENS PLACEMENT ([facility]) (Right) - US 00:30.0 AP% 7.8 CDE 2.33  Fluid Pack Lot # [id number]  Electronically signed: Galen ManilaWilliam Shea Kapur 04/04/2017 9:32 AM

## 2017-04-04 NOTE — Discharge Instructions (Signed)

## 2017-04-04 NOTE — Transfer of Care (Signed)
Immediate Anesthesia Transfer of Care Note  Patient: Vici Novick Chimil  Procedure(s) Performed: CATARACT EXTRACTION PHACO AND INTRAOCULAR LENS PLACEMENT ([facility]) (Right Eye)  Patient Location: PACU  Anesthesia Type:MAC  Level of Consciousness: awake, alert  and oriented  Airway & Oxygen Therapy: Patient Spontanous Breathing  Post-op Assessment: Report given to RN and Post -op Vital signs reviewed and stable  Post vital signs: Reviewed and stable  Last Vitals:  Vitals:   04/04/17 0826 04/04/17 0935  BP: (!) 143/73 138/67  Pulse: 67 64  Resp: 18 13  Temp: 36.4 C   SpO2: 100% 99%    Last Pain:  Vitals:   04/04/17 0826  TempSrc: Temporal         Complications: No apparent anesthesia complications

## 2017-04-04 NOTE — Anesthesia Post-op Follow-up Note (Signed)
Anesthesia QCDR form completed.        

## 2017-04-04 NOTE — Anesthesia Preprocedure Evaluation (Signed)
Anesthesia Evaluation  Patient identified by MRN, date of birth, ID band Patient awake    Reviewed: Allergy & Precautions, NPO status , Patient's Chart, lab work & pertinent test results  History of Anesthesia Complications (+) PONV and history of anesthetic complications  Airway Mallampati: II  TM Distance: >3 FB     Dental  (+) Chipped   Pulmonary sleep apnea ,    Pulmonary exam normal        Cardiovascular hypertension, + Orthopnea  Normal cardiovascular exam     Neuro/Psych  Headaches, PSYCHIATRIC DISORDERS Depression    GI/Hepatic negative GI ROS, Neg liver ROS,   Endo/Other  negative endocrine ROS  Renal/GU negative Renal ROS  negative genitourinary   Musculoskeletal  (+) Arthritis , Osteoarthritis,    Abdominal Normal abdominal exam  (+)   Peds negative pediatric ROS (+)  Hematology negative hematology ROS (+)   Anesthesia Other Findings Past Medical History: No date: Complication of anesthesia No date: Depression No date: Headache No date: Hip pain, chronic No date: History of orthopnea No date: Hypertension     Comment:  H/O NO MEDS NOW No date: PONV (postoperative nausea and vomiting) No date: Sleep apnea     Comment:  NO CPAP  Reproductive/Obstetrics                             Anesthesia Physical Anesthesia Plan  ASA: II  Anesthesia Plan: MAC   Post-op Pain Management:    Induction:   PONV Risk Score and Plan:   Airway Management Planned: Nasal Cannula  Additional Equipment:   Intra-op Plan:   Post-operative Plan:   Informed Consent: I have reviewed the patients History and Physical, chart, labs and discussed the procedure including the risks, benefits and alternatives for the proposed anesthesia with the patient or authorized representative who has indicated his/her understanding and acceptance.   Dental advisory given  Plan Discussed with: CRNA and  Surgeon  Anesthesia Plan Comments:         Anesthesia Quick Evaluation

## 2017-04-17 ENCOUNTER — Encounter: Payer: Self-pay | Admitting: *Deleted

## 2017-04-18 ENCOUNTER — Encounter
Admission: RE | Disposition: A | Discharge status: Home / Self Care | Payer: Self-pay | Source: Ambulatory Visit | Attending: Ophthalmology

## 2017-04-18 ENCOUNTER — Ambulatory Visit: Payer: Commercial Managed Care - PPO | Admitting: Certified Registered Nurse Anesthetist

## 2017-04-18 ENCOUNTER — Encounter: Payer: Self-pay | Admitting: *Deleted

## 2017-04-18 ENCOUNTER — Ambulatory Visit
Admission: RE | Admit: 2017-04-18 | Discharge: 2017-04-18 | Disposition: A | Discharge status: Home / Self Care | Payer: Commercial Managed Care - PPO | Source: Ambulatory Visit | Attending: Ophthalmology | Admitting: Ophthalmology

## 2017-04-18 ENCOUNTER — Other Ambulatory Visit: Payer: Self-pay

## 2017-04-18 DIAGNOSIS — Z79899 Other long term (current) drug therapy: Secondary | ICD-10-CM | POA: Insufficient documentation

## 2017-04-18 DIAGNOSIS — Z881 Allergy status to other antibiotic agents status: Secondary | ICD-10-CM | POA: Diagnosis not present

## 2017-04-18 DIAGNOSIS — R51 Headache: Secondary | ICD-10-CM | POA: Diagnosis not present

## 2017-04-18 DIAGNOSIS — Z8711 Personal history of peptic ulcer disease: Secondary | ICD-10-CM | POA: Diagnosis not present

## 2017-04-18 DIAGNOSIS — H2512 Age-related nuclear cataract, left eye: Secondary | ICD-10-CM | POA: Diagnosis not present

## 2017-04-18 DIAGNOSIS — Z9841 Cataract extraction status, right eye: Secondary | ICD-10-CM | POA: Insufficient documentation

## 2017-04-18 DIAGNOSIS — R0601 Orthopnea: Secondary | ICD-10-CM | POA: Insufficient documentation

## 2017-04-18 DIAGNOSIS — G4730 Sleep apnea, unspecified: Secondary | ICD-10-CM | POA: Diagnosis not present

## 2017-04-18 DIAGNOSIS — Z6841 Body Mass Index (BMI) 40.0 and over, adult: Secondary | ICD-10-CM | POA: Insufficient documentation

## 2017-04-18 HISTORY — PX: CATARACT EXTRACTION W/PHACO: SHX586

## 2017-04-18 SURGERY — PHACOEMULSIFICATION, CATARACT, WITH IOL INSERTION
Anesthesia: Monitor Anesthesia Care | Site: Eye | Laterality: Left | Wound class: Clean

## 2017-04-18 MED ORDER — CARBACHOL 0.01 % IO SOLN
INTRAOCULAR | Status: DC | PRN
  Administered 2017-04-18: 0.5 mL via INTRAOCULAR

## 2017-04-18 MED ORDER — ARMC OPHTHALMIC DILATING DROPS
1.0000 "application " | OPHTHALMIC | Status: AC
  Administered 2017-04-18 (×3): 1 via OPHTHALMIC

## 2017-04-18 MED ORDER — POVIDONE-IODINE 5 % OP SOLN
OPHTHALMIC | Status: AC
  Filled 2017-04-18: qty 30

## 2017-04-18 MED ORDER — LIDOCAINE HCL (PF) 4 % IJ SOLN
INTRAMUSCULAR | Status: AC
  Filled 2017-04-18: qty 5

## 2017-04-18 MED ORDER — EPINEPHRINE PF 1 MG/ML IJ SOLN
INTRAOCULAR | Status: DC | PRN
  Administered 2017-04-18: 09:00:00 via OPHTHALMIC

## 2017-04-18 MED ORDER — NA CHONDROIT SULF-NA HYALURON 40-17 MG/ML IO SOLN
INTRAOCULAR | Status: DC | PRN
  Administered 2017-04-18: 1 mL via INTRAOCULAR

## 2017-04-18 MED ORDER — MOXIFLOXACIN HCL 0.5 % OP SOLN
OPHTHALMIC | Status: DC | PRN
  Administered 2017-04-18: 0.2 mL via OPHTHALMIC

## 2017-04-18 MED ORDER — ARMC OPHTHALMIC DILATING DROPS
OPHTHALMIC | Status: AC
  Administered 2017-04-18: 1 via OPHTHALMIC
  Filled 2017-04-18: qty 0.4

## 2017-04-18 MED ORDER — MOXIFLOXACIN HCL 0.5 % OP SOLN
OPHTHALMIC | Status: AC
  Filled 2017-04-18: qty 3

## 2017-04-18 MED ORDER — SODIUM CHLORIDE 0.9 % IV SOLN
INTRAVENOUS | Status: DC
  Administered 2017-04-18: 08:00:00 via INTRAVENOUS

## 2017-04-18 MED ORDER — POVIDONE-IODINE 5 % OP SOLN
OPHTHALMIC | Status: DC | PRN
  Administered 2017-04-18: 1 via OPHTHALMIC

## 2017-04-18 MED ORDER — NA CHONDROIT SULF-NA HYALURON 40-17 MG/ML IO SOLN
INTRAOCULAR | Status: AC
  Filled 2017-04-18: qty 1

## 2017-04-18 MED ORDER — FENTANYL CITRATE (PF) 100 MCG/2ML IJ SOLN
INTRAMUSCULAR | Status: AC
  Filled 2017-04-18: qty 2

## 2017-04-18 MED ORDER — FENTANYL CITRATE (PF) 100 MCG/2ML IJ SOLN
INTRAMUSCULAR | Status: DC | PRN
  Administered 2017-04-18: 75 ug via INTRAVENOUS
  Administered 2017-04-18: 25 ug via INTRAVENOUS

## 2017-04-18 MED ORDER — MOXIFLOXACIN HCL 0.5 % OP SOLN: 1.0000 [drp] | OPHTHALMIC | Status: DC | PRN

## 2017-04-18 MED ORDER — EPINEPHRINE PF 1 MG/ML IJ SOLN
INTRAMUSCULAR | Status: AC
  Filled 2017-04-18: qty 2

## 2017-04-18 MED ORDER — ONDANSETRON HCL 4 MG/2ML IJ SOLN
INTRAMUSCULAR | Status: DC | PRN
  Administered 2017-04-18: 4 mg via INTRAVENOUS

## 2017-04-18 MED ORDER — BSS IO SOLN
INTRAOCULAR | Status: DC | PRN
  Administered 2017-04-18: 4 mL via OPHTHALMIC

## 2017-04-18 SURGICAL SUPPLY — 18 items
GLOVE BIO SURGEON STRL SZ8 (GLOVE) ×3 IMPLANT
GLOVE BIOGEL M 6.5 STRL (GLOVE) ×3 IMPLANT
GLOVE SURG LX 8.0 MICRO (GLOVE) ×2
GLOVE SURG LX STRL 8.0 MICRO (GLOVE) ×1 IMPLANT
GOWN STRL REUS W/ TWL LRG LVL3 (GOWN DISPOSABLE) ×2 IMPLANT
GOWN STRL REUS W/TWL LRG LVL3 (GOWN DISPOSABLE) ×4
LABEL CATARACT MEDS ST (LABEL) ×3 IMPLANT
LENS IOL ACRSF IQ TRC 6 31.0 ×1 IMPLANT
LENS IOL ACRYSOF IQ TORIC 31.0 ×2 IMPLANT
LENS IOL IQ TORIC 6 31.0 ×1 IMPLANT
PACK CATARACT (MISCELLANEOUS) ×3 IMPLANT
PACK CATARACT BRASINGTON LX (MISCELLANEOUS) ×3 IMPLANT
PACK EYE AFTER SURG (MISCELLANEOUS) ×3 IMPLANT
SOL BSS BAG (MISCELLANEOUS) ×3
SOLUTION BSS BAG (MISCELLANEOUS) ×1 IMPLANT
SYR 5ML LL (SYRINGE) ×3 IMPLANT
WATER STERILE IRR 250ML POUR (IV SOLUTION) ×3 IMPLANT
WIPE NON LINTING 3.25X3.25 (MISCELLANEOUS) ×3 IMPLANT

## 2017-04-18 NOTE — H&P (Signed)
All labs reviewed. Abnormal studies sent to patients PCP when indicated.  Previous H&P reviewed, patient examined, there are NO CHANGES.  Makayla Halliwell Porfilio2/19/20198:43 AM

## 2017-04-18 NOTE — Discharge Instructions (Signed)
Follow Dr. Gerome SamPorfilio's postop eye drop instruction sheet as reviewed.  Eye Surgery Discharge Instructions  Expect mild scratchy sensation or mild soreness. DO NOT RUB YOUR EYE!  The day of surgery:  Minimal physical activity, but bed rest is not required  No reading, computer work, or close hand work  No bending, lifting, or straining.  May watch TV  For 24 hours:  No driving, legal decisions, or alcoholic beverages  Safety precautions  Eat anything you prefer: It is better to start with liquids, then soup then solid foods.  _____ Eye patch should be worn until postoperative exam tomorrow.  ____ Solar shield eyeglasses should be worn for comfort in the sunlight/patch while sleeping  Resume all regular medications including aspirin or Coumadin if these were discontinued prior to surgery. You may shower, bathe, shave, or wash your hair. Tylenol may be taken for mild discomfort.  Call your doctor if you experience significant pain, nausea, or vomiting, fever > 101 or other signs of infection. [phone] or [phone] Specific instructions:  Follow-up Information    Galen ManilaPorfilio, William, MD Follow up.   Specialty:  Ophthalmology Why:  04/19/17 @ 10:35 am Contact information: [address] [place] [address] [phone]

## 2017-04-18 NOTE — Anesthesia Post-op Follow-up Note (Signed)
Anesthesia QCDR form completed.        

## 2017-04-18 NOTE — Anesthesia Postprocedure Evaluation (Signed)
Anesthesia Post Note  Patient: Makayla Baker  Procedure(s) Performed: CATARACT EXTRACTION PHACO AND INTRAOCULAR LENS PLACEMENT ([facility]) (Left Eye)  Patient location during evaluation: PACU Anesthesia Type: MAC Level of consciousness: awake and alert and oriented Pain management: satisfactory to patient Vital Signs Assessment: post-procedure vital signs reviewed and stable Respiratory status: respiratory function stable Cardiovascular status: stable Anesthetic complications: no     Last Vitals:  Vitals:   04/18/17 0737  BP: 126/61  Pulse: 76  Resp: 16  Temp: 36.4 C  SpO2: 100%    Last Pain:  Vitals:   04/18/17 0737  TempSrc: Temporal                 Blima Singer

## 2017-04-18 NOTE — Anesthesia Preprocedure Evaluation (Signed)
Anesthesia Evaluation  Patient identified by MRN, date of birth, ID band Patient awake    Reviewed: Allergy & Precautions, H&P , NPO status , reviewed documented beta blocker date and time   History of Anesthesia Complications (+) PONV and history of anesthetic complications  Airway Mallampati: II  TM Distance: >3 FB     Dental  (+) Teeth Intact   Pulmonary sleep apnea ,    Pulmonary exam normal        Cardiovascular hypertension, + Orthopnea  Normal cardiovascular exam     Neuro/Psych  Headaches, PSYCHIATRIC DISORDERS    GI/Hepatic   Endo/Other  Morbid obesity  Renal/GU      Musculoskeletal   Abdominal   Peds  Hematology   Anesthesia Other Findings   Reproductive/Obstetrics                             Anesthesia Physical Anesthesia Plan  ASA: III  Anesthesia Plan: General and MAC   Post-op Pain Management:    Induction:   PONV Risk Score and Plan: Propofol infusion  Airway Management Planned:   Additional Equipment:   Intra-op Plan:   Post-operative Plan:   Informed Consent: I have reviewed the patients History and Physical, chart, labs and discussed the procedure including the risks, benefits and alternatives for the proposed anesthesia with the patient or authorized representative who has indicated his/her understanding and acceptance.   Dental Advisory Given  Plan Discussed with:   Anesthesia Plan Comments:         Anesthesia Quick Evaluation

## 2017-04-18 NOTE — Op Note (Signed)
PREOPERATIVE DIAGNOSIS:  Nuclear sclerotic cataract of the left eye.   POSTOPERATIVE DIAGNOSIS:  Nuclear sclerotic cataract of the left eye.   OPERATIVE PROCEDURE: Procedure(s): CATARACT EXTRACTION PHACO AND INTRAOCULAR LENS PLACEMENT (IOC)   SURGEON:  Makayla ManilaWilliam Kerisha Goughnour, MD.   ANESTHESIA: 1.      Managed anesthesia care. 2.     0.821ml os Shugarcaine was instilled following the paracentesis 2oranesstaff@   COMPLICATIONS:  None.   TECHNIQUE:   Stop and chop    DESCRIPTION OF PROCEDURE:  The patient was examined and consented in the preoperative holding area where the aforementioned topical anesthesia was applied to the left eye.  The patient was brought back to the Operating Room where he was sat upright on the gurney and given a target to fixate upon while the eye was marked at the 3:00 and 9:00 position.  The patient was then reclined on the operating table.  The eye was prepped and draped in the usual sterile ophthalmic fashion and a lid speculum was placed. A paracentesis was created with the side port blade and the anterior chamber was filled with viscoelastic. A near clear corneal incision was performed with the steel keratome. A continuous curvilinear capsulorrhexis was performed with a cystotome followed by the capsulorrhexis forceps. Hydrodissection and hydrodelineation were carried out with BSS on a blunt cannula. The lens was removed in a stop and chop technique and the remaining cortical material was removed with the irrigation-aspiration handpiece. The eye was inflated with viscoelastic and the ZCT lens was placed in the eye and rotated to within a few degrees of the predetermined orientation.  The remaining viscoelastic was removed from the eye.  The Sinskey hook was used to rotate the toric lens into its final resting place at 106 degrees.  0.1 ml of Vigamox was placed in the anterior chamber. The eye was inflated to a physiologic pressure and found to be watertight.  The eye was dressed  with Vigamox. The patient was given protective glasses to wear throughout the day and a shield with which to sleep tonight. The patient was also given drops with which to begin a drop regimen today and will follow-up with me in one day. Implant Name Type Inv. Item Serial No. Manufacturer Lot No. LRB No. Used  LENS IOL TORIC 31.0 - [id number]  LENS IOL TORIC 31.0 [id number] 051 ALCON  Left 1   Procedure(s) with comments: CATARACT EXTRACTION PHACO AND INTRAOCULAR LENS PLACEMENT (IOC) (Left) - US 00:18.6 AP% 7.5 CDE 1.39 Fluid Pack Lot # [id number]  Electronically signed: Galen ManilaWilliam Nevea Spiewak 2/19/20199:09 AM

## 2017-04-18 NOTE — Transfer of Care (Signed)
Immediate Anesthesia Transfer of Care Note  Patient: Makayla Baker  Procedure(s) Performed: CATARACT EXTRACTION PHACO AND INTRAOCULAR LENS PLACEMENT (IOC) (Left Eye)  Patient Location: PACU  Anesthesia Type:MAC  Level of Consciousness: awake, alert  and oriented  Airway & Oxygen Therapy: Patient Spontanous Breathing  Post-op Assessment: Report given to RN and Post -op Vital signs reviewed and stable  Post vital signs: Reviewed and stable  Last Vitals:  Vitals:   04/18/17 0737  BP: 126/61  Pulse: 76  Resp: 16  Temp: 36.4 C  SpO2: 100%    Last Pain:  Vitals:   04/18/17 0737  TempSrc: Temporal         Complications: No apparent anesthesia complications

## 2017-04-18 NOTE — Anesthesia Procedure Notes (Signed)
Procedure Name: MAC Performed by: Cree Kunert, CRNA Pre-anesthesia Checklist: Patient identified, Emergency Drugs available, Suction available, Patient being monitored and Timeout performed Oxygen Delivery Method: Nasal cannula       

## 2017-08-21 ENCOUNTER — Other Ambulatory Visit: Payer: Self-pay | Admitting: Nurse Practitioner

## 2017-08-21 DIAGNOSIS — G501 Atypical facial pain: Secondary | ICD-10-CM

## 2017-09-05 ENCOUNTER — Ambulatory Visit: Payer: Commercial Managed Care - PPO

## 2017-12-07 ENCOUNTER — Other Ambulatory Visit: Payer: Self-pay | Admitting: Obstetrics & Gynecology

## 2017-12-07 DIAGNOSIS — Z1231 Encounter for screening mammogram for malignant neoplasm of breast: Secondary | ICD-10-CM

## 2017-12-27 ENCOUNTER — Ambulatory Visit
Admission: RE | Admit: 2017-12-27 | Discharge: 2017-12-27 | Disposition: A | Discharge status: Home / Self Care | Payer: Commercial Managed Care - PPO | Source: Ambulatory Visit | Attending: Obstetrics & Gynecology | Admitting: Obstetrics & Gynecology

## 2017-12-27 DIAGNOSIS — Z1231 Encounter for screening mammogram for malignant neoplasm of breast: Secondary | ICD-10-CM | POA: Insufficient documentation

## 2019-01-11 ENCOUNTER — Other Ambulatory Visit: Payer: Self-pay | Admitting: Obstetrics & Gynecology

## 2019-01-11 DIAGNOSIS — Z1231 Encounter for screening mammogram for malignant neoplasm of breast: Secondary | ICD-10-CM

## 2019-01-22 ENCOUNTER — Ambulatory Visit
Admission: RE | Admit: 2019-01-22 | Discharge: 2019-01-22 | Disposition: A | Discharge status: Home / Self Care | Payer: Commercial Managed Care - PPO | Source: Ambulatory Visit | Attending: Obstetrics & Gynecology | Admitting: Obstetrics & Gynecology

## 2019-01-22 DIAGNOSIS — Z1231 Encounter for screening mammogram for malignant neoplasm of breast: Secondary | ICD-10-CM | POA: Insufficient documentation

## 2019-04-02 ENCOUNTER — Other Ambulatory Visit: Payer: Self-pay | Admitting: Otolaryngology

## 2019-04-02 ENCOUNTER — Other Ambulatory Visit (HOSPITAL_COMMUNITY): Payer: Self-pay | Admitting: Otolaryngology

## 2019-04-02 DIAGNOSIS — M542 Cervicalgia: Secondary | ICD-10-CM

## 2019-04-02 DIAGNOSIS — R221 Localized swelling, mass and lump, neck: Secondary | ICD-10-CM

## 2019-04-12 ENCOUNTER — Ambulatory Visit
Admission: RE | Admit: 2019-04-12 | Discharge: 2019-04-12 | Disposition: A | Discharge status: Home / Self Care | Payer: Commercial Managed Care - PPO | Source: Ambulatory Visit | Attending: Otolaryngology | Admitting: Otolaryngology

## 2019-04-12 ENCOUNTER — Other Ambulatory Visit: Payer: Self-pay

## 2019-04-12 DIAGNOSIS — M542 Cervicalgia: Secondary | ICD-10-CM | POA: Diagnosis present

## 2019-04-12 DIAGNOSIS — R221 Localized swelling, mass and lump, neck: Secondary | ICD-10-CM | POA: Diagnosis present

## 2019-04-12 LAB — POCT I-STAT CREATININE: Creatinine, Ser: 0.4 mg/dL — ABNORMAL LOW (ref 0.44–1.00)

## 2019-04-12 MED ORDER — IOHEXOL 300 MG/ML  SOLN
75.0000 mL | Freq: Once | INTRAMUSCULAR | Status: AC | PRN
  Administered 2019-04-12: 16:00:00 75 mL via INTRAVENOUS

## 2019-05-28 DIAGNOSIS — E559 Vitamin D deficiency, unspecified: Secondary | ICD-10-CM | POA: Insufficient documentation

## 2019-10-29 ENCOUNTER — Ambulatory Visit: Payer: Commercial Managed Care - PPO | Attending: Neurology | Admitting: Physical Therapy

## 2019-10-29 ENCOUNTER — Other Ambulatory Visit: Payer: Self-pay

## 2019-10-29 ENCOUNTER — Encounter: Payer: Self-pay | Admitting: Physical Therapy

## 2019-10-29 DIAGNOSIS — M25511 Pain in right shoulder: Secondary | ICD-10-CM | POA: Diagnosis present

## 2019-10-29 DIAGNOSIS — G8929 Other chronic pain: Secondary | ICD-10-CM | POA: Diagnosis present

## 2019-10-29 DIAGNOSIS — M6281 Muscle weakness (generalized): Secondary | ICD-10-CM | POA: Diagnosis present

## 2019-10-29 DIAGNOSIS — R293 Abnormal posture: Secondary | ICD-10-CM | POA: Diagnosis present

## 2019-10-29 DIAGNOSIS — M5481 Occipital neuralgia: Secondary | ICD-10-CM | POA: Insufficient documentation

## 2019-10-29 NOTE — Therapy (Signed)
[place] [facility] [address] [place], [place], [address] Phone: [phone]   Fax:  [phone]  Physical Therapy Evaluation  Patient Details  Name: Makayla Baker MRN: [id number] Date of Birth: May 30, 1965 Referring Provider (PT): Dr. Cristopher Peru   Encounter Date: 10/29/2019   PT End of Session - 10/29/19 1718    Visit Number 1    Number of Visits 5    Date for PT Re-Evaluation 11/26/19    Authorization - Visit Number 1    Authorization - Number of Visits 10    PT Start Time 1706    PT Stop Time 1807    PT Time Calculation (min) 61 min    Activity Tolerance Patient tolerated treatment well;Patient limited by pain    Behavior During Therapy [facility] for tasks assessed/performed           Past Medical History:  Diagnosis Date  . Complication of anesthesia   . Depression   . Headache   . Hip pain, chronic   . History of orthopnea   . Hypertension    H/O NO MEDS NOW  . PONV (postoperative nausea and vomiting)    no trouble with cataract surgery  . Sleep apnea    NO CPAP    Past Surgical History:  Procedure Laterality Date  . CATARACT EXTRACTION W/PHACO Right 04/04/2017   Procedure: CATARACT EXTRACTION PHACO AND INTRAOCULAR LENS PLACEMENT (IOC);  Surgeon: Galen Manila, MD;  Location: [facility] ORS;  Service: Ophthalmology;  Laterality: Right;  [place] 00:30.0 AP% 7.8 CDE 2.33  Fluid Pack Lot # [id number] H  . CATARACT EXTRACTION W/PHACO Left 04/18/2017   Procedure: CATARACT EXTRACTION PHACO AND INTRAOCULAR LENS PLACEMENT (IOC);  Surgeon: Galen Manila, MD;  Location: [facility] ORS;  Service: Ophthalmology;  Laterality: Left;  [place] 00:18.6 AP% 7.5 CDE 1.39 Fluid Pack Lot # [id number] H  . NASAL SINUS SURGERY      There were no vitals filed for this visit.   No interpreter present    Subjective Assessment - 10/29/19 1709    Subjective Pt. reports periodic HA (R side of head).  Pt. reports 5-6/10 pain currently at rest.  Increase  HA with cervical rotn./ R shoulder flexion.    Pertinent History Pt. reports pain in head started in 2014.  Pt. reports pain as stayed the same since having wisdom teeth removed.  Pt. states she had difficulty closing mouth (?dislocation of jaw) and required MD to assist with closing.  Pt. has pain in R jaw with chewing.  Pt. reports no improvement in pain with medications.  Pt. reports HAs occur 1-2x/month.  Pt. reports ice and sleep helps to decrease pain.    Patient Stated Goals Decrease pain/ HA    Currently in Pain? Yes    Pain Score 6     Pain Location Head    Pain Orientation Right         AROM: Cervical flex: 45 deg. (slight discomfort) Cervical ext: 55 deg. (pain) Cervical rotation: R 63 deg./ L 55 deg. (pain with L/R rotn.) L lateral flex: 40 deg. (pain on R)- 4/10 pain R lateral flex: 50 deg. (pain)  B shoulder AROM WNL (increase pain with R sh. Flexion/ abduction)   STRENGTH: L/R; out of 5 Cervical flex: 5 (pain) Cervical ext: 5 Cervical Rotation: 5/5 (pain with both) Lateral flex: 5/5 (pain with both)  Good cervical traction (decrease pain).    Bilat upper cervical paraspinals and suboccipital muscles tight and tender to  palpation. Right upper trap tender to palpation  Facial expressions equal and strong, pain with increased jaw opening on R.   R and L shoulder flexion: 4+/5 MMT R and L shoulder abduction: 4+/5 MMT Grip strength: L 28#,  R 30#  [facility] PT Assessment - 10/29/19 0001      Assessment   Medical Diagnosis Occipital neuralgia    Referring Provider (PT) Dr. Cristopher Peru    Onset Date/Surgical Date 02/29/12    Hand Dominance Right    Prior Therapy Not for current issue      Home Environment   Living Environment Private residence      Prior Function   Level of Independence Independent    Vocation Full time employment      Cognition   Overall Cognitive Status Within Functional Limits for tasks assessed             Objective measurements  completed on examination: See above findings.            Plan - 10/29/19 1720    Clinical Impression Statement Pt. is a pleasant 54 y/o female with chronic c/o R facial pain.  Pt. c/o 5/10 R sided facial pain currently at rest and no pain at best, 8/10 at worst.  Pt. reports periodic HAs 1-2x/month and benefits from rest/ice.  Pt. pain limited with all planes of cervical AROM except cervical extension (55 deg.).    Stability/Clinical Decision Making Evolving/Moderate complexity    Rehab Potential Fair    PT Frequency 1x / week    PT Duration 4 weeks    PT Treatment/Interventions ADLs/Self Care Home Management;Biofeedback;Electrical Stimulation;Cryotherapy;Therapeutic exercise;Therapeutic activities;Functional mobility training;Neuromuscular re-education;Patient/family education;Manual techniques;Passive range of motion;Dry needling;Energy conservation    Consulted and Agree with Plan of Care Patient           Patient will benefit from skilled therapeutic intervention in order to improve the following deficits and impairments:  Decreased range of motion, Decreased strength, Pain, Postural dysfunction, Impaired flexibility, Decreased mobility  Visit Diagnosis: Occipital neuralgia of right side  Chronic right shoulder pain  Muscle weakness (generalized)  Abnormal posture     Problem List There are no problems to display for this patient.   Sharyn Creamer, SPT 10/29/2019, 6:36 PM  [place] [facility] [address] [place], [place], [address] Phone: [phone]   Fax:  [phone]  Name: Makayla Baker MRN: [id number] Date of Birth: 05-24-1965

## 2019-11-05 ENCOUNTER — Other Ambulatory Visit: Payer: Self-pay

## 2019-11-05 ENCOUNTER — Encounter: Payer: Self-pay | Admitting: Physical Therapy

## 2019-11-05 ENCOUNTER — Ambulatory Visit: Payer: Commercial Managed Care - PPO | Attending: Neurology

## 2019-11-05 DIAGNOSIS — R293 Abnormal posture: Secondary | ICD-10-CM | POA: Diagnosis present

## 2019-11-05 DIAGNOSIS — M25511 Pain in right shoulder: Secondary | ICD-10-CM | POA: Insufficient documentation

## 2019-11-05 DIAGNOSIS — M6281 Muscle weakness (generalized): Secondary | ICD-10-CM

## 2019-11-05 DIAGNOSIS — M5481 Occipital neuralgia: Secondary | ICD-10-CM | POA: Insufficient documentation

## 2019-11-05 DIAGNOSIS — G8929 Other chronic pain: Secondary | ICD-10-CM | POA: Insufficient documentation

## 2019-11-05 NOTE — Therapy (Addendum)
[place] [facility] [address] [place], [place], [address] Phone: [phone]   Fax:  [phone]  Physical Therapy Treatment  Patient Details  Name: Makayla Baker MRN: [id number] Date of Birth: Aug 31, 1965 Referring Provider (PT): Dr. Cristopher Peru   Encounter Date: 11/05/2019    11/05/19 1653  PT Visits / Re-Eval  Visit Number 2  Number of Visits 5  Date for PT Re-Evaluation 11/26/19  Authorization  Authorization - Visit Number 2  Authorization - Number of Visits 10  PT Time Calculation  PT Start Time 1645  PT Stop Time 1729  PT Time Calculation (min) 44 min  PT - End of Session  Activity Tolerance Patient tolerated treatment well;Patient limited by pain  Behavior During Therapy [facility] for tasks assessed/performed    Past Medical History:  Diagnosis Date  . Complication of anesthesia   . Depression   . Headache   . Hip pain, chronic   . History of orthopnea   . Hypertension    H/O NO MEDS NOW  . PONV (postoperative nausea and vomiting)    no trouble with cataract surgery  . Sleep apnea    NO CPAP    Past Surgical History:  Procedure Laterality Date  . CATARACT EXTRACTION W/PHACO Right 04/04/2017   Procedure: CATARACT EXTRACTION PHACO AND INTRAOCULAR LENS PLACEMENT (IOC);  Surgeon: Galen Manila, MD;  Location: [facility] ORS;  Service: Ophthalmology;  Laterality: Right;  [place] 00:30.0 AP% 7.8 CDE 2.33  Fluid Pack Lot # [id number] H  . CATARACT EXTRACTION W/PHACO Left 04/18/2017   Procedure: CATARACT EXTRACTION PHACO AND INTRAOCULAR LENS PLACEMENT (IOC);  Surgeon: Galen Manila, MD;  Location: [facility] ORS;  Service: Ophthalmology;  Laterality: Left;  [place] 00:18.6 AP% 7.5 CDE 1.39 Fluid Pack Lot # [id number] H  . NASAL SINUS SURGERY      There were no vitals filed for this visit.   Subjective Assessment - 11/05/19 1652    Subjective Pt. reports that shoulder is feeling a bit better. No HA reported this week. No pain currently,  though felt pain in R eye around 2:30    Pertinent History Pt. reports pain in head started in 2014.  Pt. reports pain as stayed the same since having wisdom teeth removed.  Pt. states she had difficulty closing mouth (?dislocation of jaw) and required MD to assist with closing.  Pt. has pain in R jaw with chewing.  Pt. reports no improvement in pain with medications.  Pt. reports HAs occur 1-2x/month.  Pt. reports ice and sleep helps to decrease pain.    Patient Stated Goals Decrease pain/ HA    Currently in Pain? No/denies             Manual:  Manual traction to cervical spine: 5x30 sec holds  STM to bilat cervical paraspinals and R upper trap: completed in supine, PT added passive lateral flex both directions and completed STM to bilat upper traps  There Ex:  Supine chin tucks: 15x with mod verbal and tactile cueing   Seated chin tucks: 15x with mild verbal and tactile cueing  Seated scap retractions: 10x with initial tactile cueing for proper positioning.   Mopping demonstration to maintain better cervical alignment, as well as using body to complete instead of just arms. 5 mins   Seated upper trap stretches: 3x30 sec each side                         PT Long  Term Goals - 10/29/19 1748      PT LONG TERM GOAL #1   Title Pt will improve L cervical lateral flex to 50 deg to improve functional mobility.    Baseline 8/31: 40 deg    Time 4    Period Weeks    Status New    Target Date 11/26/19      PT LONG TERM GOAL #2   Title Pt. will report being able to bend over in garden without onset of 4/10 pain in head to improve pain free functional mobility.    Baseline 8/31: pt reports immediate onset of pain to 4/10 with bending over    Time 4    Period Weeks    Status New    Target Date 11/26/19      PT LONG TERM GOAL #3   Title Pt. will be able to complete all cervical range of motion with no more than 3/10 pain in R side neck to improve pain free  functional mobility.    Baseline 8/31: pt experiences 5/10 pain with cervical AROM    Time 4    Period Weeks    Status New    Target Date 11/26/19      PT LONG TERM GOAL #4   Title Pt. will report pain no more than 3/10 in R side neck with overhead motion to improve pain free functional mobility.    Baseline 8/31: pt experiences 5-6/10 pain in neck with overhead motions    Time 4    Period Weeks    Status New    Target Date 11/26/19      PT LONG TERM GOAL #5   Title Pt. will improve FOTO to 65 to improve pain free functional mobility.    Baseline 8/31: 53/65    Time 4    Period Weeks    Status New    Target Date 11/26/19                 11/05/19 1731  Plan  Clinical Impression Statement Pt. returns to therapy with good carryover from completing stretches and decreased pain overall. Pt. reports no new HA in the past week. Pt. completed supine and seated chin tucks, and responded well to manual therapy (cervical traction and STM to bilat cervical paraspinals). Pt. demonstrated mopping technique today that causes pain, PT adjusted technique to improve cervical alignment and decrease sx. Pt. will continue to benefit from skilled PT to improve pain free functional mobility.  Pt will benefit from skilled therapeutic intervention in order to improve on the following deficits Decreased range of motion;Decreased strength;Pain;Postural dysfunction;Impaired flexibility;Decreased mobility  Stability/Clinical Decision Making Evolving/Moderate complexity  Clinical Decision Making Moderate  Rehab Potential Fair  PT Frequency 1x / week  PT Duration 4 weeks  PT Treatment/Interventions ADLs/Self Care Home Management;Biofeedback;Electrical Stimulation;Cryotherapy;Therapeutic exercise;Therapeutic activities;Functional mobility training;Neuromuscular re-education;Patient/family education;Manual techniques;Passive range of motion;Dry needling;Energy conservation  PT Home Exercise Plan [id number]   Consulted and Agree with Plan of Care Patient      Visit Diagnosis: Occipital neuralgia of right side  Muscle weakness (generalized)  Abnormal posture  Chronic right shoulder pain     Problem List There are no problems to display for this patient.   Sharyn Creamer, SPT 11/05/2019, 5:12 PM  [facility] [address] [place], [place], [address] Phone: [phone]   Fax:  [phone]  Name: Makayla Baker MRN: [id number] Date of Birth: [birth year]

## 2019-11-12 ENCOUNTER — Ambulatory Visit: Payer: Commercial Managed Care - PPO | Admitting: Physical Therapy

## 2019-11-14 ENCOUNTER — Other Ambulatory Visit: Payer: Self-pay

## 2019-11-14 ENCOUNTER — Ambulatory Visit: Payer: Commercial Managed Care - PPO

## 2019-11-14 ENCOUNTER — Encounter: Payer: Self-pay | Admitting: Physical Therapy

## 2019-11-14 DIAGNOSIS — M5481 Occipital neuralgia: Secondary | ICD-10-CM

## 2019-11-14 DIAGNOSIS — G8929 Other chronic pain: Secondary | ICD-10-CM

## 2019-11-14 DIAGNOSIS — M6281 Muscle weakness (generalized): Secondary | ICD-10-CM

## 2019-11-14 DIAGNOSIS — R293 Abnormal posture: Secondary | ICD-10-CM

## 2019-11-14 NOTE — Therapy (Signed)
[facility] [address] [place], [place], [address] Phone: [phone]   Fax:  [phone]  Physical Therapy Treatment  Patient Details  Name: Makayla Baker MRN: [id number] Date of Birth: 01/11/1966 Referring Provider (PT): Dr. Cristopher Peru   Encounter Date: 11/14/2019   PT End of Session - 11/14/19 1657    Visit Number 3    Number of Visits 5    Date for PT Re-Evaluation 11/26/19    Authorization - Visit Number 3    Authorization - Number of Visits 10    PT Start Time 1658    PT Stop Time 1740    PT Time Calculation (min) 42 min    Activity Tolerance Patient tolerated treatment well;Patient limited by pain    Behavior During Therapy [facility] for tasks assessed/performed           Past Medical History:  Diagnosis Date   Complication of anesthesia    Depression    Headache    Hip pain, chronic    History of orthopnea    Hypertension    H/O NO MEDS NOW   PONV (postoperative nausea and vomiting)    no trouble with cataract surgery   Sleep apnea    NO CPAP    Past Surgical History:  Procedure Laterality Date   CATARACT EXTRACTION W/PHACO Right 04/04/2017   Procedure: CATARACT EXTRACTION PHACO AND INTRAOCULAR LENS PLACEMENT (IOC);  Surgeon: Galen Manila, MD;  Location: [facility] ORS;  Service: Ophthalmology;  Laterality: Right;  [place] 00:30.0 AP% 7.8 CDE 2.33  Fluid Pack Lot # [id number] H   CATARACT EXTRACTION W/PHACO Left 04/18/2017   Procedure: CATARACT EXTRACTION PHACO AND INTRAOCULAR LENS PLACEMENT (IOC);  Surgeon: Galen Manila, MD;  Location: [facility] ORS;  Service: Ophthalmology;  Laterality: Left;  [place] 00:18.6 AP% 7.5 CDE 1.39 Fluid Pack Lot # [id number]   NASAL SINUS SURGERY      There were no vitals filed for this visit.   Subjective Assessment - 11/14/19 1659    Subjective Pt. reports that her upper neck pain on the R is about 5/10, started around 1:30pm after bending over and cleaning. No HA.     Pertinent History Pt. reports pain in head started in 2014.  Pt. reports pain as stayed the same since having wisdom teeth removed.  Pt. states she had difficulty closing mouth (?dislocation of jaw) and required MD to assist with closing.  Pt. has pain in R jaw with chewing.  Pt. reports no improvement in pain with medications.  Pt. reports HAs occur 1-2x/month.  Pt. reports ice and sleep helps to decrease pain.    Patient Stated Goals Decrease pain/ HA    Currently in Pain? Yes    Pain Score 5     Pain Location Head    Pain Orientation Right           Manual:  Manual traction to cervical spine: 5x30 sec holds  STM to bilat cervical paraspinals and R upper trap: completed in supine, pt's R upper trap felt better than last session with less tightness. Upper paraspinals/suboccipitals on R felt tight, loosened up with STM  Grade 2-3 lateral slide-glides to C2-4 bilat: Pt in supine, C2-3 felt slightly hypomobile, felt more mobile after glides. Completed 3x15 sec per level each side.   There ex:  Supine chin tucks: 10x, pt required verbal and tactile cues for most reps, demonstrates fair carryover from last session.   Supine chin tuck with lift: 10x.  Verbal and tactile cueing to maintain chin tuck throughout lift.   Supine R upper trap stretch: 3x30 sec. Cueing to maintain proper form.   Supine chin tucks with L lateral flexion: 5x  Seated chin tucks: 5x. Education provided on doing these at work with various positioning.      Pt initially at 5/10, at end of session 1/10 in R upper cervical paraspinals                          PT Long Term Goals - 10/29/19 1748      PT LONG TERM GOAL #1   Title Pt will improve L cervical lateral flex to 50 deg to improve functional mobility.    Baseline 8/31: 40 deg    Time 4    Period Weeks    Status New    Target Date 11/26/19      PT LONG TERM GOAL #2   Title Pt. will report being able to bend over in garden  without onset of 4/10 pain in head to improve pain free functional mobility.    Baseline 8/31: pt reports immediate onset of pain to 4/10 with bending over    Time 4    Period Weeks    Status New    Target Date 11/26/19      PT LONG TERM GOAL #3   Title Pt. will be able to complete all cervical range of motion with no more than 3/10 pain in R side neck to improve pain free functional mobility.    Baseline 8/31: pt experiences 5/10 pain with cervical AROM    Time 4    Period Weeks    Status New    Target Date 11/26/19      PT LONG TERM GOAL #4   Title Pt. will report pain no more than 3/10 in R side neck with overhead motion to improve pain free functional mobility.    Baseline 8/31: pt experiences 5-6/10 pain in neck with overhead motions    Time 4    Period Weeks    Status New    Target Date 11/26/19      PT LONG TERM GOAL #5   Title Pt. will improve FOTO to 65 to improve pain free functional mobility.    Baseline 8/31: 53/65    Time 4    Period Weeks    Status New    Target Date 11/26/19                 Plan - 11/14/19 1746    Clinical Impression Statement Pt. returns to therapy with increased pain from last session, believes it started after having to bend over for prolonged periods of time for cleaning. Pt. continues to respond well to manual therapy on upper cervical paraspinals and suboccipitals on R side, as well as grade 3 lateral slide-glides. Pt. demonstrated fair carryover for chin tuck techniques, after verbal and tactile cueing pt was able to perform them with good form. Pt. educated on completing chin tucks during work activities. Pt. will continue to benefit from skilled PT to improve pain free functional mobility and work related taks.    Stability/Clinical Decision Making Evolving/Moderate complexity    Clinical Decision Making Moderate    Rehab Potential Fair    PT Frequency 1x / week    PT Duration 4 weeks    PT Treatment/Interventions ADLs/Self Care  Home Management;Biofeedback;Electrical Stimulation;Cryotherapy;Therapeutic exercise;Therapeutic activities;Functional mobility training;Neuromuscular re-education;Patient/family education;Manual  techniques;Passive range of motion;Dry needling;Energy conservation    PT Home Exercise Plan [id number]    Consulted and Agree with Plan of Care Patient           Patient will benefit from skilled therapeutic intervention in order to improve the following deficits and impairments:  Decreased range of motion, Decreased strength, Pain, Postural dysfunction, Impaired flexibility, Decreased mobility  Visit Diagnosis: Occipital neuralgia of right side  Muscle weakness (generalized)  Chronic right shoulder pain  Abnormal posture     Problem List There are no problems to display for this patient.   Sharyn Creamer, SPT 11/14/2019, 5:48 PM  [facility] [address] [place], [place], [address] Phone: [phone]   Fax:  [phone]  Name: Makayla Baker MRN: [id number] Date of Birth: Feb 28, 1966

## 2019-11-19 ENCOUNTER — Other Ambulatory Visit: Payer: Self-pay

## 2019-11-19 ENCOUNTER — Ambulatory Visit: Payer: Commercial Managed Care - PPO | Admitting: Physical Therapy

## 2019-11-19 ENCOUNTER — Encounter: Payer: Self-pay | Admitting: Physical Therapy

## 2019-11-19 DIAGNOSIS — M6281 Muscle weakness (generalized): Secondary | ICD-10-CM

## 2019-11-19 DIAGNOSIS — M5481 Occipital neuralgia: Secondary | ICD-10-CM

## 2019-11-19 DIAGNOSIS — G8929 Other chronic pain: Secondary | ICD-10-CM

## 2019-11-19 DIAGNOSIS — R293 Abnormal posture: Secondary | ICD-10-CM

## 2019-11-19 NOTE — Therapy (Signed)
[facility] [address]. [place], [place], [address] Phone: [phone]   Fax:  [phone]  Physical Therapy Treatment  Patient Details  Name: Makayla Baker MRN: [id number] Date of Birth: Jan 23, 1966 Referring Provider (PT): Dr. Cristopher Peru   Encounter Date: 11/19/2019   PT End of Session - 11/19/19 1608    Visit Number 4    Number of Visits 5    Date for PT Re-Evaluation 11/26/19    Authorization - Visit Number 4    Authorization - Number of Visits 10    PT Start Time 1600    PT Stop Time 1642    PT Time Calculation (min) 42 min    Activity Tolerance Patient tolerated treatment well;Patient limited by pain    Behavior During Therapy [facility] for tasks assessed/performed           Past Medical History:  Diagnosis Date  . Complication of anesthesia   . Depression   . Headache   . Hip pain, chronic   . History of orthopnea   . Hypertension    H/O NO MEDS NOW  . PONV (postoperative nausea and vomiting)    no trouble with cataract surgery  . Sleep apnea    NO CPAP    Past Surgical History:  Procedure Laterality Date  . CATARACT EXTRACTION W/PHACO Right 04/04/2017   Procedure: CATARACT EXTRACTION PHACO AND INTRAOCULAR LENS PLACEMENT (IOC);  Surgeon: Galen Manila, MD;  Location: [facility] ORS;  Service: Ophthalmology;  Laterality: Right;  [place] 00:30.0 AP% 7.8 CDE 2.33  Fluid Pack Lot # [id number] H  . CATARACT EXTRACTION W/PHACO Left 04/18/2017   Procedure: CATARACT EXTRACTION PHACO AND INTRAOCULAR LENS PLACEMENT (IOC);  Surgeon: Galen Manila, MD;  Location: [facility] ORS;  Service: Ophthalmology;  Laterality: Left;  [place] 00:18.6 AP% 7.5 CDE 1.39 Fluid Pack Lot # [id number] H  . NASAL SINUS SURGERY      There were no vitals filed for this visit.   Subjective Assessment - 11/19/19 1604    Subjective Pt. reports that she will start new job on Monday (9/27). No HA reported over the weekend, reports that she's been feeling  better over past few days.    Pertinent History Pt. reports pain in head started in 2014.  Pt. reports pain as stayed the same since having wisdom teeth removed.  Pt. states she had difficulty closing mouth (?dislocation of jaw) and required MD to assist with closing.  Pt. has pain in R jaw with chewing.  Pt. reports no improvement in pain with medications.  Pt. reports HAs occur 1-2x/month.  Pt. reports ice and sleep helps to decrease pain.    Patient Stated Goals Decrease pain/ HA    Currently in Pain? No/denies          Manual:  STM to bilat cervical paraspinals and R upper trap: completed in supine, pt's R upper trap felt better than last session with less tightness. Upper paraspinals/suboccipitals on R felt tight, loosened up with STM.   There Ex:  Supine R upper trap stretch: 3x30 sec. Cueing to maintain proper form.    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 Pt demonstrated proper posture with how she should be carrying things, as well as education to complete scap retractions and upper trap stretches throughout the day.         PT Long Term Goals - 10/29/19 1748      PT LONG  TERM GOAL #1   Title Pt will improve L cervical lateral flex to 50 deg to improve functional mobility.    Baseline 8/31: 40 deg    Time 4    Period Weeks    Status New    Target Date 11/26/19      PT LONG TERM GOAL #2   Title Pt. will report being able to bend over in garden without onset of 4/10 pain in head to improve pain free functional mobility.    Baseline 8/31: pt reports immediate onset of pain to 4/10 with bending over    Time 4    Period Weeks    Status New    Target Date 11/26/19      PT LONG TERM GOAL #3   Title Pt. will be able to complete all cervical range of motion with no more than 3/10 pain in R side neck to improve pain free functional mobility.    Baseline 8/31: pt experiences 5/10 pain with cervical AROM     Time 4    Period Weeks    Status New    Target Date 11/26/19      PT LONG TERM GOAL #4   Title Pt. will report pain no more than 3/10 in R side neck with overhead motion to improve pain free functional mobility.    Baseline 8/31: pt experiences 5-6/10 pain in neck with overhead motions    Time 4    Period Weeks    Status New    Target Date 11/26/19      PT LONG TERM GOAL #5   Title Pt. will improve FOTO to 65 to improve pain free functional mobility.    Baseline 8/31: 53/65    Time 4    Period Weeks    Status New    Target Date 11/26/19                 Plan - 11/19/19 1644    Clinical Impression Statement Pt. returns to therapy with no new reports of pain since last session. Pt. will begin new job next week that should be less demanding on neck and shoulder. Pt. responded well to manual therapy on cervical paraspinals and R upper trap, continues to respond well to upper trap stretches. Pt. will continue to benefit from skilled PT to improve pain free functional mobility and work related tasks.    Stability/Clinical Decision Making Evolving/Moderate complexity    Clinical Decision Making Moderate    Rehab Potential Fair    PT Frequency 1x / week    PT Duration 4 weeks    PT Treatment/Interventions ADLs/Self Care Home Management;Biofeedback;Electrical Stimulation;Cryotherapy;Therapeutic exercise;Therapeutic activities;Functional mobility training;Neuromuscular re-education;Patient/family education;Manual techniques;Passive range of motion;Dry needling;Energy conservation    PT Home Exercise Plan [id number]    Consulted and Agree with Plan of Care Patient           Patient will benefit from skilled therapeutic intervention in order to improve the following deficits and impairments:  Decreased range of motion, Decreased strength, Pain, Postural dysfunction, Impaired flexibility, Decreased mobility  Visit Diagnosis: Occipital neuralgia of right side  Muscle weakness  (generalized)  Chronic right shoulder pain  Abnormal posture     Problem List There are no problems to display for this patient.  Cammie Mcgee, PT, DPT # [id number] Sharyn Creamer, SPT 11/20/2019, 8:10 AM  [facility] [facility] [address] [place], [place], [address] Phone: [phone]   Fax:  [phone]  Name: Krisann Mckenna MRN: [id number] Date of Birth: Jul 09, 1965

## 2019-11-21 ENCOUNTER — Ambulatory Visit: Payer: Commercial Managed Care - PPO | Admitting: Physical Therapy

## 2019-11-26 ENCOUNTER — Ambulatory Visit: Payer: Commercial Managed Care - PPO | Admitting: Physical Therapy

## 2019-11-28 ENCOUNTER — Other Ambulatory Visit: Payer: Self-pay

## 2019-11-28 ENCOUNTER — Ambulatory Visit: Payer: Commercial Managed Care - PPO

## 2019-11-28 ENCOUNTER — Encounter: Payer: Self-pay | Admitting: Physical Therapy

## 2019-11-28 DIAGNOSIS — M5481 Occipital neuralgia: Secondary | ICD-10-CM | POA: Diagnosis not present

## 2019-11-28 DIAGNOSIS — R293 Abnormal posture: Secondary | ICD-10-CM

## 2019-11-28 DIAGNOSIS — G8929 Other chronic pain: Secondary | ICD-10-CM

## 2019-11-28 DIAGNOSIS — M6281 Muscle weakness (generalized): Secondary | ICD-10-CM

## 2019-11-28 NOTE — Therapy (Signed)
[place] [facility] [address]. [place], [place], [address] Phone: [phone]   Fax:  [phone]  Physical Therapy Treatment, Discharge, and Recertification Recert dates: 08/26/45-6/54/65  Patient Details  Name: Evolette Pendell MRN: [id number] Date of Birth: [birth year] Referring Provider (PT): Dr. Jennings Books   Encounter Date: 11/28/2019   PT End of Session - 11/28/19 0821    Visit Number 5    Number of Visits 5    Date for PT Re-Evaluation 11/26/19    Authorization - Visit Number 5    Authorization - Number of Visits 10    PT Start Time 0815    PT Stop Time 0856    PT Time Calculation (min) 41 min    Activity Tolerance Patient tolerated treatment well;Patient limited by pain    Behavior During Therapy [facility] for tasks assessed/performed           Past Medical History:  Diagnosis Date  . Complication of anesthesia   . Depression   . Headache   . Hip pain, chronic   . History of orthopnea   . Hypertension    H/O NO MEDS NOW  . PONV (postoperative nausea and vomiting)    no trouble with cataract surgery  . Sleep apnea    NO CPAP    Past Surgical History:  Procedure Laterality Date  . CATARACT EXTRACTION W/PHACO Right 04/04/2017   Procedure: CATARACT EXTRACTION PHACO AND INTRAOCULAR LENS PLACEMENT (IOC);  Surgeon: Birder Robson, MD;  Location: [facility] ORS;  Service: Ophthalmology;  Laterality: Right;  [place] 00:30.0 AP% 7.8 CDE 2.33  Fluid Pack Lot # [id number] H  . CATARACT EXTRACTION W/PHACO Left 04/18/2017   Procedure: CATARACT EXTRACTION PHACO AND INTRAOCULAR LENS PLACEMENT (IOC);  Surgeon: Birder Robson, MD;  Location: [facility] ORS;  Service: Ophthalmology;  Laterality: Left;  [place] 00:18.6 AP% 7.5 CDE 1.39 Fluid Pack Lot # [id number] H  . NASAL SINUS SURGERY      There were no vitals filed for this visit.   Subjective Assessment - 11/28/19 0818    Subjective Pt. reports that she started her new job this past Monday, and  has not had any new sx since starting. Her shoulder has been feeling better.    Pertinent History Pt. reports pain in head started in 2014.  Pt. reports pain as stayed the same since having wisdom teeth removed.  Pt. states she had difficulty closing mouth (?dislocation of jaw) and required MD to assist with closing.  Pt. has pain in R jaw with chewing.  Pt. reports no improvement in pain with medications.  Pt. reports HAs occur 1-2x/month.  Pt. reports ice and sleep helps to decrease pain.    Patient Stated Goals Decrease pain/ HA    Currently in Pain? No/denies             Manual:  STM to bilat cervical paraspinals: completed in supine, PT felt no tension in either upper trap today. Slight tightness in R upper cervical paraspinals/suboccipitals. Pt initially felt tightness/pain with R cervical rotation 2/10, pt felt 1/10 pain after STM.   There Ex:  Supine R upper trap stretch: 3x30 sec.   Cervical AROM all directions: re-measured for goals. See updated goals for details.                             PT Long Term Goals - 11/28/19 0820      PT LONG TERM GOAL #1  Title Pt will improve L cervical lateral flex to 50 deg to improve functional mobility.    Baseline 8/31: 40 deg, 9/30: 50 deg    Time 4    Period Weeks    Status Achieved    Target Date 11/28/19      PT LONG TERM GOAL #2   Title Pt. will report being able to bend over in garden without onset of 4/10 pain in head to improve pain free functional mobility.    Baseline 8/31: pt reports immediate onset of pain to 4/10 with bending over.    Time 4    Period Weeks    Status Achieved    Target Date 11/28/19      PT LONG TERM GOAL #3   Title Pt. will be able to complete all cervical range of motion with no more than 3/10 pain in R side neck to improve pain free functional mobility.    Baseline 8/31: pt experiences 5/10 pain with cervical AROM. 9/30: R rotation 1/10 pain in neck, all other motions  0/10    Time 4    Period Weeks    Status New    Target Date 11/28/19      PT LONG TERM GOAL #4   Title Pt. will report pain no more than 3/10 in R side neck with overhead motion to improve pain free functional mobility.    Baseline 8/31: pt experiences 5-6/10 pain in neck with overhead motions. 9/30: pt reports no pain with overhead motions.    Time 4    Period Weeks    Status Achieved    Target Date 11/28/19      PT LONG TERM GOAL #5   Title Pt. will improve FOTO to 65 to improve pain free functional mobility.    Baseline 8/31: 53/65. 9/30: 63/65    Time 4    Period Weeks    Status Partially Met    Target Date 11/28/19                 Plan - 11/28/19 0857    Clinical Impression Statement Pt. returns to therapy after starting her new job for a few days. Pt. reports no new pain, and that overall her sx have been improving. PT updated goals, pt met all goals except FOTO goal, she scored 63/65. Pt. demonstrated improved pain free cervical ROM. PT performed final session of manual therapy on neck, which feels significantly improved since starting therpay. Pt. responded well to manual tx. PT instructed pt. to call clinic in case of any regression or recurrence of sx. Pt agreeable to discharge at this time. .    Stability/Clinical Decision Making Evolving/Moderate complexity    Clinical Decision Making Moderate    Rehab Potential Fair    PT Frequency 1x / week    PT Duration 4 weeks    PT Treatment/Interventions ADLs/Self Care Home Management;Biofeedback;Electrical Stimulation;Cryotherapy;Therapeutic exercise;Therapeutic activities;Functional mobility training;Neuromuscular re-education;Patient/family education;Manual techniques;Passive range of motion;Dry needling;Energy conservation    PT Home Exercise Plan [id number]    Consulted and Agree with Plan of Care Patient           Patient will benefit from skilled therapeutic intervention in order to improve the following deficits  and impairments:  Decreased range of motion, Decreased strength, Pain, Postural dysfunction, Impaired flexibility, Decreased mobility  Visit Diagnosis: Occipital neuralgia of right side  Muscle weakness (generalized)  Chronic right shoulder pain  Abnormal posture     Problem List There are no  problems to display for this patient.   Carlyle Basques, SPT 11/28/2019, 9:06 AM  [facility] [address] [place], [place], [address] Phone: [phone]   Fax:  [phone]  Name: Esmirna Ravan Chimil MRN: [id number] Date of Birth: 06-09-1965

## 2019-12-03 ENCOUNTER — Ambulatory Visit: Payer: Commercial Managed Care - PPO | Admitting: Physical Therapy

## 2019-12-04 ENCOUNTER — Other Ambulatory Visit: Payer: Self-pay | Admitting: Internal Medicine

## 2019-12-04 DIAGNOSIS — Z1231 Encounter for screening mammogram for malignant neoplasm of breast: Secondary | ICD-10-CM

## 2019-12-10 ENCOUNTER — Ambulatory Visit: Payer: Commercial Managed Care - PPO | Admitting: Physical Therapy

## 2019-12-17 ENCOUNTER — Ambulatory Visit: Payer: Commercial Managed Care - PPO | Admitting: Physical Therapy

## 2019-12-24 ENCOUNTER — Ambulatory Visit: Payer: Commercial Managed Care - PPO | Admitting: Physical Therapy

## 2020-03-09 ENCOUNTER — Other Ambulatory Visit: Payer: Self-pay

## 2020-03-09 ENCOUNTER — Ambulatory Visit
Admission: RE | Admit: 2020-03-09 | Discharge: 2020-03-09 | Disposition: A | Discharge status: Home / Self Care | Payer: Commercial Managed Care - PPO | Source: Ambulatory Visit | Attending: Internal Medicine | Admitting: Internal Medicine

## 2020-03-09 DIAGNOSIS — Z1231 Encounter for screening mammogram for malignant neoplasm of breast: Secondary | ICD-10-CM | POA: Insufficient documentation

## 2020-12-16 ENCOUNTER — Other Ambulatory Visit: Payer: Self-pay | Admitting: Internal Medicine

## 2020-12-16 DIAGNOSIS — Z1231 Encounter for screening mammogram for malignant neoplasm of breast: Secondary | ICD-10-CM

## 2021-01-27 LAB — HM PAP SMEAR: HM Pap smear: NEGATIVE

## 2021-03-10 ENCOUNTER — Other Ambulatory Visit: Payer: Self-pay

## 2021-03-10 ENCOUNTER — Ambulatory Visit
Admission: RE | Admit: 2021-03-10 | Discharge: 2021-03-10 | Disposition: A | Discharge status: Home / Self Care | Payer: Commercial Managed Care - PPO | Source: Ambulatory Visit | Attending: Internal Medicine | Admitting: Internal Medicine

## 2021-03-10 DIAGNOSIS — Z1231 Encounter for screening mammogram for malignant neoplasm of breast: Secondary | ICD-10-CM | POA: Diagnosis not present

## 2021-05-22 ENCOUNTER — Emergency Department
Admission: EM | Admit: 2021-05-22 | Discharge: 2021-05-22 | Disposition: A | Discharge status: Home / Self Care | Payer: Commercial Managed Care - PPO | Attending: Emergency Medicine | Admitting: Emergency Medicine

## 2021-05-22 ENCOUNTER — Emergency Department: Payer: Commercial Managed Care - PPO

## 2021-05-22 DIAGNOSIS — I10 Essential (primary) hypertension: Secondary | ICD-10-CM | POA: Insufficient documentation

## 2021-05-22 DIAGNOSIS — K859 Acute pancreatitis without necrosis or infection, unspecified: Secondary | ICD-10-CM | POA: Insufficient documentation

## 2021-05-22 DIAGNOSIS — R1013 Epigastric pain: Secondary | ICD-10-CM | POA: Diagnosis present

## 2021-05-22 LAB — CBC
HCT: 46.6 % — ABNORMAL HIGH (ref 36.0–46.0)
Hemoglobin: 15.4 g/dL — ABNORMAL HIGH (ref 12.0–15.0)
MCH: 30.4 pg (ref 26.0–34.0)
MCHC: 33 g/dL (ref 30.0–36.0)
MCV: 91.9 fL (ref 80.0–100.0)
Platelets: 214 10*3/uL (ref 150–400)
RBC: 5.07 MIL/uL (ref 3.87–5.11)
RDW: 12.6 % (ref 11.5–15.5)
WBC: 14 10*3/uL — ABNORMAL HIGH (ref 4.0–10.5)
nRBC: 0 % (ref 0.0–0.2)

## 2021-05-22 LAB — COMPREHENSIVE METABOLIC PANEL
ALT: 34 U/L (ref 0–44)
AST: 30 U/L (ref 15–41)
Albumin: 4.2 g/dL (ref 3.5–5.0)
Alkaline Phosphatase: 80 U/L (ref 38–126)
Anion gap: 9 (ref 5–15)
BUN: 17 mg/dL (ref 6–20)
CO2: 27 mmol/L (ref 22–32)
Calcium: 8.7 mg/dL — ABNORMAL LOW (ref 8.9–10.3)
Chloride: 103 mmol/L (ref 98–111)
Creatinine, Ser: 0.49 mg/dL (ref 0.44–1.00)
GFR, Estimated: >60 mL/min (ref >60)
Glucose, Bld: 180 mg/dL — ABNORMAL HIGH (ref 70–99)
Potassium: 3.2 mmol/L — ABNORMAL LOW (ref 3.5–5.1)
Sodium: 139 mmol/L (ref 135–145)
Total Bilirubin: 1 mg/dL (ref 0.3–1.2)
Total Protein: 7.9 g/dL (ref 6.5–8.1)

## 2021-05-22 LAB — LIPASE, BLOOD: Lipase: 71 U/L — ABNORMAL HIGH (ref 11–51)

## 2021-05-22 MED ORDER — ONDANSETRON 4 MG PO TBDP: 4.0000 mg | ORAL_TABLET | Freq: Once | ORAL | Status: DC | PRN

## 2021-05-22 MED ORDER — OXYCODONE-ACETAMINOPHEN 5-325 MG PO TABS: 1.0000 | ORAL_TABLET | ORAL | 0 refills | Status: DC | PRN

## 2021-05-22 MED ORDER — FENTANYL CITRATE PF 50 MCG/ML IJ SOSY
50.0000 ug | PREFILLED_SYRINGE | Freq: Once | INTRAMUSCULAR | Status: AC
  Administered 2021-05-22: 50 ug via INTRAVENOUS
  Filled 2021-05-22: qty 1

## 2021-05-22 MED ORDER — FAMOTIDINE IN NACL 20-0.9 MG/50ML-% IV SOLN
20.0000 mg | Freq: Once | INTRAVENOUS | Status: AC
  Administered 2021-05-22: 20 mg via INTRAVENOUS
  Filled 2021-05-22: qty 50

## 2021-05-22 MED ORDER — ONDANSETRON HCL 4 MG/2ML IJ SOLN
4.0000 mg | Freq: Once | INTRAMUSCULAR | Status: AC
Start: 2021-05-22 — End: 2021-05-22
  Administered 2021-05-22: 4 mg via INTRAVENOUS
  Filled 2021-05-22: qty 2

## 2021-05-22 MED ORDER — ONDANSETRON 4 MG PO TBDP: 4.0000 mg | ORAL_TABLET | Freq: Three times a day (TID) | ORAL | 0 refills | Status: DC | PRN

## 2021-05-22 NOTE — ED Triage Notes (Signed)
Pt reports having emesis since last night around 2100. Pt reports epigastric pain with history of gastric ulcer. Denies any blood in noticed in her Emesis.  ?

## 2021-05-22 NOTE — ED Provider Notes (Signed)
? ?Makayla Baker ?Provider Note ? ? ? Event Date/Time  ? First MD Initiated Contact with Patient 05/22/21 0444   ?  (approximate) ? ? ?History  ? ?Abdominal Pain and Emesis ? ? ?HPI ? ?Makayla SpeedLeticia Matias Baker is a 56 y.o. female with a history of gastritis and hypertension who presents for evaluation of abdominal pain.  Patient reports that she vomited 1 time and 9 PM and then another 1 at midnight.  After the second episode of emesis she started having epigastric abdominal pain that she describes as swelling sensation.  She has a history of peptic ulcer disease.  She denies alcohol use or NSAIDs.  She denies coffee-ground emesis, melena, hematochezia, or hematemesis.  She is complaining of constant severe epigastric pain.  No prior abdominal surgeries.  No diarrhea or constipation, no fever or chills, no cough, no dysuria or hematuria, no chest pain or shortness of breath ?  ? ? ?Past Medical History:  ?Diagnosis Date  ? Complication of anesthesia   ? Depression   ? Headache   ? Hip pain, chronic   ? History of orthopnea   ? Hypertension   ? H/O NO MEDS NOW  ? PONV (postoperative nausea and vomiting)   ? no trouble with cataract surgery  ? Sleep apnea   ? NO CPAP  ? ? ?Past Surgical History:  ?Procedure Laterality Date  ? CATARACT EXTRACTION W/PHACO Right 04/04/2017  ? Procedure: CATARACT EXTRACTION PHACO AND INTRAOCULAR LENS PLACEMENT (IOC);  Surgeon: Galen ManilaPorfilio, William, MD;  Location: [facility] ORS;  Service: Ophthalmology;  Laterality: Right;  US 00:30.0 ?AP% 7.8 ?CDE 2.33 ? Fluid Pack Lot # [id number]  ? CATARACT EXTRACTION W/PHACO Left 04/18/2017  ? Procedure: CATARACT EXTRACTION PHACO AND INTRAOCULAR LENS PLACEMENT (IOC);  Surgeon: Galen ManilaPorfilio, William, MD;  Location: [facility] ORS;  Service: Ophthalmology;  Laterality: Left;  US 00:18.6 ?AP% 7.5 ?CDE 1.39 ?Fluid Pack Lot # [id number] H  ? NASAL SINUS SURGERY    ? ? ? ?Physical Exam  ? ?Triage Vital Signs: ?ED Triage Vitals [05/22/21 0340]  ?Enc Vitals Group  ?    BP (!) 147/63  ?   Pulse Rate 92  ?   Resp 20  ?   Temp 98.1 ?F (36.7 ?C)  ?   Temp src   ?   SpO2 92 %  ?   Weight   ?   Height   ?   Head Circumference   ?   Peak Flow   ?   Pain Score 8  ?   Pain Loc   ?   Pain Edu?   ?   Excl. in GC?   ? ? ?Most recent vital signs: ?Vitals:  ? 05/22/21 0539 05/22/21 0600  ?BP: (!) 129/59 122/64  ?Pulse: 74 77  ?Resp: 16 16  ?Temp:    ?SpO2: 97% 97%  ? ? ? ?Constitutional: Alert and oriented. Well appearing and in no apparent distress. ?HEENT: ?     Head: Normocephalic and atraumatic.    ?     Eyes: Conjunctivae are normal. Sclera is non-icteric.  ?     Mouth/Throat: Mucous membranes are moist.  ?     Neck: Supple with no signs of meningismus. ?Cardiovascular: Regular rate and rhythm. No murmurs, gallops, or rubs. 2+ symmetrical distal pulses are present in all extremities.  ?Respiratory: Normal respiratory effort. Lungs are clear to auscultation bilaterally.  ?Gastrointestinal: Soft, tender to palpation in the epigastric region, and non distended with positive  bowel sounds. No rebound or guarding. ?Genitourinary: No CVA tenderness. ?Musculoskeletal:  No edema, cyanosis, or erythema of extremities. ?Neurologic: Normal speech and language. Face is symmetric. Moving all extremities. No gross focal neurologic deficits are appreciated. ?Skin: Skin is warm, dry and intact. No rash noted. ?Psychiatric: Mood and affect are normal. Speech and behavior are normal. ? ?ED Results / Procedures / Treatments  ? ?Labs ?(all labs ordered are listed, but only abnormal results are displayed) ?Labs Reviewed  ?LIPASE, BLOOD - Abnormal; Notable for the following components:  ?    Result Value  ? Lipase 71 (*)   ? All other components within normal limits  ?COMPREHENSIVE METABOLIC PANEL - Abnormal; Notable for the following components:  ? Potassium 3.2 (*)   ? Glucose, Bld 180 (*)   ? Calcium 8.7 (*)   ? All other components within normal limits  ?CBC - Abnormal; Notable for the following components:   ? WBC 14.0 (*)   ? Hemoglobin 15.4 (*)   ? HCT 46.6 (*)   ? All other components within normal limits  ? ? ? ?EKG ? ?ED ECG REPORT ?I, Nita Sickle, the attending physician, personally viewed and interpreted this ECG. ? ?Sinus rhythm with a rate of 81, normal intervals, normal axis, diffuse T wave flattening with no ST elevations or depression ? ?RADIOLOGY ?I, Nita Sickle, attending MD, have personally viewed and interpreted the images obtained during this visit as below: ? ?Ultrasound negative for cholecystitis or cholelithiasis ? ? ?___________________________________________________ ?Interpretation by Radiologist:  ?US ABDOMEN LIMITED RUQ (LIVER/GB) ? ?Result Date: 05/22/2021 ?CLINICAL DATA:  56 year old female with history of epigastric pain for several hours. EXAM: ULTRASOUND ABDOMEN LIMITED RIGHT UPPER QUADRANT COMPARISON:  11/25/2010. FINDINGS: Gallbladder: No gallstones or wall thickening visualized. No sonographic Murphy sign noted by sonographer. Common bile duct: Diameter: 3.9 mm Liver: No focal lesion identified. Diffusely increased hepatic echogenicity, indicative of hepatic steatosis. Portal vein is patent on color Doppler imaging with normal direction of blood flow towards the liver. Other: None. IMPRESSION: 1. No acute findings. Specifically, no cholelithiasis or findings to suggest acute cholecystitis at this time. 2. Hepatic steatosis. Electronically Signed   By: Trudie Reed M.D.   On: 05/22/2021 06:49   ? ? ? ?PROCEDURES: ? ?Critical Care performed: No ? ?Procedures ? ? ? ?IMPRESSION / MDM / ASSESSMENT AND PLAN / ED COURSE  ?I reviewed the triage vital signs and the nursing notes. ? ?56 y.o. female with a history of gastritis and hypertension who presents for evaluation of abdominal pain.  Patient complaining of epigastric abdominal pain that started after 2 episodes of nonbloody nonbilious emesis.  She is well-appearing in no distress, abdomen is soft and nondistended with mild  epigastric tenderness, no right upper quadrant tenderness, negative Murphy sign, no rebound or guarding ? ?Ddx: Gastritis versus peptic ulcer disease versus pancreatitis versus gallbladder pathology versus UTI versus pyelonephritis versus kidney stone versus diverticulitis versus appendicitis ? ? ?Plan: CBC, CMP, lipase, urinalysis, CT abdomen pelvis.  Will give IV fentanyl and Zofran for symptom relief.  Will give IV Pepcid ? ? ?MEDICATIONS GIVEN IN ED: ?Medications  ?ondansetron (ZOFRAN-ODT) disintegrating tablet 4 mg (has no administration in time range)  ?famotidine (PEPCID) IVPB 20 mg premix (0 mg Intravenous Stopped 05/22/21 0601)  ?fentaNYL (SUBLIMAZE) injection 50 mcg (50 mcg Intravenous Given 05/22/21 0526)  ?ondansetron [facility]) injection 4 mg (4 mg Intravenous Given 05/22/21 0526)  ? ? ? ?ED COURSE: Patient with leukocytosis and a white count  of 14.  Lipase of 71 with normal LFTs and T. bili.  Right upper quadrant ultrasound showing no cholelithiasis or cholecystitis.  Presentation concerning for pancreatitis.  Admission was considered but felt unnecessary since patient's pain resolved and she was otherwise well-appearing.  She is tolerating p.o. with no further episodes of vomiting.  Per Ranson criteria patient is low risk.  We discussed bland diet, pain and nausea control at home and increase hydration.  Recommended close follow-up with her primary care doctor.  Discussed my standard return precautions. ? ? ?Consults: None ? ? ?EMR reviewed including last visit with her primary care doctor for her annual physical exam from September 2022 ? ? ? ?FINAL CLINICAL IMPRESSION(S) / ED DIAGNOSES  ? ?Final diagnoses:  ?Acute pancreatitis without infection or necrosis, unspecified pancreatitis type  ? ? ? ?Rx / DC Orders  ? ?ED Discharge Orders   ? ?      Ordered  ?  ondansetron (ZOFRAN-ODT) 4 MG disintegrating tablet  Every 8 hours PRN       ? 05/22/21 0655  ?  oxyCODONE-acetaminophen (PERCOCET) 5-325 MG tablet   Every 4 hours PRN       ? 05/22/21 0655  ? ?  ?  ? ?  ? ? ? ?Note:  This document was prepared using Dragon voice recognition software and may include unintentional dictation errors. ? ? ?Please note:  Pa

## 2021-05-22 NOTE — ED Notes (Signed)
US @ the bedside. 

## 2021-12-02 ENCOUNTER — Other Ambulatory Visit: Payer: Self-pay | Admitting: Internal Medicine

## 2021-12-02 DIAGNOSIS — E538 Deficiency of other specified B group vitamins: Secondary | ICD-10-CM | POA: Insufficient documentation

## 2021-12-02 DIAGNOSIS — Z1231 Encounter for screening mammogram for malignant neoplasm of breast: Secondary | ICD-10-CM

## 2021-12-25 ENCOUNTER — Ambulatory Visit: Payer: Commercial Managed Care - PPO

## 2021-12-25 DIAGNOSIS — Z23 Encounter for immunization: Secondary | ICD-10-CM

## 2022-03-11 ENCOUNTER — Other Ambulatory Visit: Payer: Self-pay

## 2022-03-11 DIAGNOSIS — Z1231 Encounter for screening mammogram for malignant neoplasm of breast: Secondary | ICD-10-CM

## 2022-04-05 ENCOUNTER — Ambulatory Visit: Payer: Self-pay | Attending: Hematology and Oncology | Admitting: Hematology and Oncology

## 2022-04-05 ENCOUNTER — Ambulatory Visit
Admission: RE | Admit: 2022-04-05 | Discharge: 2022-04-05 | Disposition: A | Discharge status: Home / Self Care | Payer: Self-pay | Source: Ambulatory Visit | Attending: Obstetrics and Gynecology | Admitting: Obstetrics and Gynecology

## 2022-04-05 VITALS — BP 156/89 | Wt 185.0 lb

## 2022-04-05 DIAGNOSIS — Z1231 Encounter for screening mammogram for malignant neoplasm of breast: Secondary | ICD-10-CM | POA: Insufficient documentation

## 2022-04-05 NOTE — Progress Notes (Signed)
[name] is a 57 y.o. female who presents to [facility] clinic today with no complaints.    Pap Smear: Pap not smear completed today. Last Pap smear was 2022 at [facility] clinic and was normal. Per patient has no history of an abnormal Pap smear. Last Pap smear result is available in Epic.   Physical exam: Breasts Breasts symmetrical. No skin abnormalities bilateral breasts. No nipple retraction bilateral breasts. No nipple discharge bilateral breasts. No lymphadenopathy. No lumps palpated bilateral breasts.     MM 3D SCREEN BREAST BILATERAL  Result Date: 03/10/2021 CLINICAL DATA:  Screening. EXAM: DIGITAL SCREENING BILATERAL MAMMOGRAM WITH TOMOSYNTHESIS AND CAD TECHNIQUE: Bilateral screening digital craniocaudal and mediolateral oblique mammograms were obtained. Bilateral screening digital breast tomosynthesis was performed. The images were evaluated with computer-aided detection. COMPARISON:  Previous exam(s). ACR Breast Density Category b: There are scattered areas of fibroglandular density. FINDINGS: There are no findings suspicious for malignancy. IMPRESSION: No mammographic evidence of malignancy. A result letter of this screening mammogram will be mailed directly to the patient. RECOMMENDATION: Screening mammogram in one year. (Code:SM-B-01Y) BI-RADS CATEGORY  1: Negative. Electronically Signed   By: Margarette Canada M.D.   On: 03/10/2021 10:43  MM 3D SCREEN BREAST BILATERAL  Result Date: 03/09/2020 CLINICAL DATA:  Screening. EXAM: DIGITAL SCREENING BILATERAL MAMMOGRAM WITH TOMO AND CAD COMPARISON:  Previous exam(s). ACR Breast Density Category b: There are scattered areas of fibroglandular density. FINDINGS: There are no findings suspicious for malignancy. Images were processed with CAD. IMPRESSION: No mammographic evidence of malignancy. A result letter of this screening mammogram will be mailed directly to the patient. RECOMMENDATION: Screening mammogram in one year. (Code:SM-B-01Y)  BI-RADS CATEGORY  1: Negative. Electronically Signed   By: Claudie Revering M.D.   On: 03/09/2020 09:57   MM 3D SCREEN BREAST BILATERAL  Result Date: 01/22/2019 CLINICAL DATA:  Screening. EXAM: DIGITAL SCREENING BILATERAL MAMMOGRAM WITH TOMO AND CAD COMPARISON:  Previous exam(s). ACR Breast Density Category a: The breast tissue is almost entirely fatty. FINDINGS: There are no findings suspicious for malignancy. Images were processed with CAD. IMPRESSION: No mammographic evidence of malignancy. A result letter of this screening mammogram will be mailed directly to the patient. RECOMMENDATION: Screening mammogram in one year. (Code:SM-B-01Y) BI-RADS CATEGORY  1: Negative. Electronically Signed   By: Ammie Ferrier M.D.   On: 01/22/2019 15:38   MM 3D SCREEN BREAST BILATERAL  Result Date: 12/28/2017 CLINICAL DATA:  Screening. EXAM: DIGITAL SCREENING BILATERAL MAMMOGRAM WITH TOMO AND CAD COMPARISON:  Previous exam(s). ACR Breast Density Category b: There are scattered areas of fibroglandular density. FINDINGS: There are no findings suspicious for malignancy. Images were processed with CAD. IMPRESSION: No mammographic evidence of malignancy. A result letter of this screening mammogram will be mailed directly to the patient. RECOMMENDATION: Screening mammogram in one year. (Code:SM-B-01Y) BI-RADS CATEGORY  1: Negative. Electronically Signed   By: Fidela Salisbury M.D.   On: 12/28/2017 08:58      Pelvic/Bimanual Pap is not indicated today    Smoking History: Patient has never smoked and was not referred to quit line.    Patient Navigation: Patient education provided. Access to services provided for patient through [place] interpreter provided. No transportation provided   Colorectal Cancer Screening: Per patient has never had colonoscopy completed No complaints today. FIT test negative per [facility] clinic   Breast and Cervical Cancer Risk Assessment: Patient does not have family  history of breast cancer, known genetic mutations, or radiation treatment  to the chest before age 70. Patient does not have history of cervical dysplasia, immunocompromised, or DES exposure in-utero.  Risk Assessment   No risk assessment data     A: [facility] exam without pap smear No complaints with benign exam.   P: [facility]. Appointment scheduled 04/05/22.  Melodye Ped, NP 04/05/2022 2:23 PM

## 2022-04-05 NOTE — Patient Instructions (Signed)
[place] about self breast awareness and gave educational materials to take home. Patient did not need a Pap smear today due to last Pap smear was in 2022 per patient.  She will be due in 2027. Let her know [facility] will cover Pap smears every 5 years unless has a history of abnormal Pap smears. [facility] for [facility]. Appointment scheduled for 04/05/22. Patient aware of appointment and will be there. Let patient know will follow up with her within the next couple weeks with results. [place] verbalized understanding.  Melodye Ped, NP 2:23 PM

## 2022-05-03 DIAGNOSIS — F419 Anxiety disorder, unspecified: Secondary | ICD-10-CM | POA: Insufficient documentation

## 2022-05-03 DIAGNOSIS — J45909 Unspecified asthma, uncomplicated: Secondary | ICD-10-CM | POA: Insufficient documentation

## 2022-05-09 DIAGNOSIS — R7303 Prediabetes: Secondary | ICD-10-CM | POA: Insufficient documentation

## 2022-08-01 ENCOUNTER — Encounter: Payer: Self-pay | Admitting: Emergency Medicine

## 2022-08-01 ENCOUNTER — Ambulatory Visit
Admission: EM | Admit: 2022-08-01 | Discharge: 2022-08-01 | Disposition: A | Discharge status: Home / Self Care | Payer: BLUE CROSS/BLUE SHIELD | Attending: Physician Assistant | Admitting: Physician Assistant

## 2022-08-01 DIAGNOSIS — R051 Acute cough: Secondary | ICD-10-CM

## 2022-08-01 DIAGNOSIS — H1033 Unspecified acute conjunctivitis, bilateral: Secondary | ICD-10-CM | POA: Diagnosis present

## 2022-08-01 DIAGNOSIS — J020 Streptococcal pharyngitis: Secondary | ICD-10-CM

## 2022-08-01 LAB — GROUP A STREP BY PCR: Group A Strep by PCR: DETECTED — AB

## 2022-08-01 MED ORDER — AMOXICILLIN 500 MG PO CAPS: 500.0000 mg | ORAL_CAPSULE | Freq: Two times a day (BID) | ORAL | 0 refills | Status: AC

## 2022-08-01 MED ORDER — LIDOCAINE VISCOUS HCL 2 % MT SOLN: 15.0000 mL | OROMUCOSAL | 0 refills | Status: DC | PRN

## 2022-08-01 MED ORDER — NAPHAZOLINE-PHENIRAMINE 0.025-0.3 % OP SOLN: 1.0000 [drp] | Freq: Four times a day (QID) | OPHTHALMIC | 0 refills | Status: DC | PRN

## 2022-08-01 MED ORDER — PROMETHAZINE-DM 6.25-15 MG/5ML PO SYRP: 5.0000 mL | ORAL_SOLUTION | Freq: Four times a day (QID) | ORAL | 0 refills | Status: DC | PRN

## 2022-08-01 NOTE — ED Triage Notes (Signed)
Pt presents with a sore throat, cough and bilateral eye irritation x 5 days. She was seen at her PCP office and prescribed ibuprofen, eye drops and tessalon.

## 2022-08-01 NOTE — ED Provider Notes (Signed)
[facility]    CSN: [id number] Arrival date & time: 08/01/22  0941      History   Chief Complaint Chief Complaint  Patient presents with   Sore Throat   Eye Irritation   Cough    HPI Makayla Baker is a 57 y.o. female presenting for 5-day history of sore throat, postnasal drainage, nasal congestion, dry cough, bilateral eye redness and crusting.  Patient has already been evaluated for her symptoms twice with the last evaluation 3 days ago.  She has been diagnosed with viral illness.  She also has a history of allergies.  She is currently using an antibiotic eyedrop and benzonatate as well as taking ibuprofen.  Patient reports no relief in her symptoms.  Feels that her symptoms are getting worse especially her throat pain.  Denies fever, sinus pain, chest pain, shortness of breath.  Denies sick contacts.  Does not regularly take allergy medication.  No other complaints.  HPI  Past Medical History:  Diagnosis Date   Complication of anesthesia    Depression    Headache    Hip pain, chronic    History of orthopnea    Hypertension    H/O NO MEDS NOW   PONV (postoperative nausea and vomiting)    no trouble with cataract surgery   Sleep apnea    NO CPAP    There are no problems to display for this patient.   Past Surgical History:  Procedure Laterality Date   CATARACT EXTRACTION W/PHACO Right 04/04/2017   Procedure: CATARACT EXTRACTION PHACO AND INTRAOCULAR LENS PLACEMENT (IOC);  Surgeon: Galen Manila, MD;  Location: [facility] ORS;  Service: Ophthalmology;  Laterality: Right;  [place] 00:30.0 AP% 7.8 CDE 2.33  Fluid Pack Lot # [id number] H   CATARACT EXTRACTION W/PHACO Left 04/18/2017   Procedure: CATARACT EXTRACTION PHACO AND INTRAOCULAR LENS PLACEMENT (IOC);  Surgeon: Galen Manila, MD;  Location: [facility] ORS;  Service: Ophthalmology;  Laterality: Left;  [place] 00:18.6 AP% 7.5 CDE 1.39 Fluid Pack Lot # [id number]   NASAL SINUS SURGERY      OB History   No  obstetric history on file.      Home Medications    Prior to Admission medications   Medication Sig Start Date End Date Taking? Authorizing Provider  amoxicillin (AMOXIL) 500 MG capsule Take 1 capsule (500 mg total) by mouth 2 (two) times daily for 10 days. 08/01/22 08/11/22 Yes Shirlee Latch, PA-C  benzonatate (TESSALON) 100 MG capsule Take 100 mg by mouth 3 (three) times daily as needed. 07/31/22  Yes [provider]  ibuprofen (ADVIL) 600 MG tablet Take by mouth. 07/28/22  Yes [provider]  ipratropium (ATROVENT) 0.03 % nasal spray SMARTSIG:2 Spray(s) Both Nares 3 Times Daily PRN 07/31/22  Yes [provider]  lidocaine (XYLOCAINE) 2 % solution Use as directed 15 mLs in the mouth or throat every 3 (three) hours as needed for mouth pain (swish and spit). 08/01/22  Yes Shirlee Latch, PA-C  naphazoline-pheniramine (NAPHCON-A) 0.025-0.3 % ophthalmic solution Place 1 drop into both eyes 4 (four) times daily as needed for eye irritation. 08/01/22  Yes Shirlee Latch, PA-C  promethazine-dextromethorphan (PROMETHAZINE-DM) 6.25-15 MG/5ML syrup Take 5 mLs by mouth 4 (four) times daily as needed. 08/01/22  Yes Shirlee Latch, PA-C  Ascorbic Acid (VITAMIN C) 1000 MG tablet Take 1,000 mg by mouth daily.    [provider]  carbamazepine (TEGRETOL XR) 100 MG 12 hr tablet Take 100 mg by mouth  daily.    [provider]  cyanocobalamin (VITAMIN B12) 1000 MCG tablet Take by mouth. 12/02/21   [provider]  ergocalciferol (VITAMIN D2) 50000 UNITS capsule Take 50,000 Units by mouth once a week.    [provider]  ondansetron (ZOFRAN-ODT) 4 MG disintegrating tablet Take 1 tablet (4 mg total) by mouth every 8 (eight) hours as needed for nausea or vomiting. 05/22/21   Don Perking, [place], MD  oxyCODONE-acetaminophen (PERCOCET) 5-325 MG tablet Take 1 tablet by mouth every 4 (four) hours as needed. 05/22/21   Nita Sickle, MD    Family History Family  History  Problem Relation Age of Onset   Breast cancer Neg Hx     Social History Social History   Tobacco Use   Smoking status: Never   Smokeless tobacco: Never  Vaping Use   Vaping Use: Never used  Substance Use Topics   Alcohol use: No   Drug use: Never     Allergies   Dexamethasone and Hydrocodone-acetaminophen   Review of Systems Review of Systems  Constitutional:  Positive for fatigue. Negative for chills, diaphoresis and fever.  HENT:  Positive for congestion, postnasal drip, rhinorrhea and sore throat. Negative for ear pain, sinus pressure and sinus pain.   Eyes:  Positive for discharge and redness. Negative for pain.  Respiratory:  Positive for cough. Negative for shortness of breath.   Cardiovascular:  Negative for chest pain.  Gastrointestinal:  Negative for abdominal pain, nausea and vomiting.  Musculoskeletal:  Negative for arthralgias and myalgias.  Skin:  Negative for rash.  Neurological:  Negative for weakness and headaches.  Hematological:  Negative for adenopathy.     Physical Exam Triage Vital Signs ED Triage Vitals  Enc Vitals Group     BP      Pulse      Resp      Temp      Temp src      SpO2      Weight      Height      Head Circumference      Peak Flow      Pain Score      Pain Loc      Pain Edu?      Excl. in GC?    No data found.  Updated Vital Signs BP 126/80 (BP Location: Right Arm)   Pulse 67   Temp 98.5 F (36.9 C) (Oral)   Resp 16   LMP 11/25/2014 (Approximate)   SpO2 95%    Physical Exam Vitals and nursing note reviewed.  Constitutional:      General: She is not in acute distress.    Appearance: Normal appearance. She is well-developed. She is not ill-appearing or toxic-appearing.  HENT:     Head: Normocephalic and atraumatic.     Nose: Congestion present.     Mouth/Throat:     Mouth: Mucous membranes are moist.     Pharynx: Oropharynx is clear. Posterior oropharyngeal erythema (mild with clear PND) present.   Eyes:     General: No scleral icterus.       Right eye: No discharge.        Left eye: No discharge.     Conjunctiva/sclera:     Right eye: Right conjunctiva is injected.     Left eye: Left conjunctiva is injected.  Cardiovascular:     Rate and Rhythm: Normal rate and regular rhythm.     Heart sounds: Normal heart sounds.  Pulmonary:  Effort: Pulmonary effort is normal. No respiratory distress.     Breath sounds: Normal breath sounds.  Musculoskeletal:     Cervical back: Neck supple.  Skin:    General: Skin is dry.  Neurological:     General: No focal deficit present.     Mental Status: She is alert. Mental status is at baseline.     Motor: No weakness.     Gait: Gait normal.  Psychiatric:        Mood and Affect: Mood normal.        Behavior: Behavior normal.        Thought Content: Thought content normal.      UC Treatments / Results  Labs (all labs ordered are listed, but only abnormal results are displayed) Labs Reviewed  GROUP A STREP BY PCR - Abnormal; Notable for the following components:      Result Value   Group A Strep by PCR DETECTED (*)    All other components within normal limits    EKG   Radiology No results found.  Procedures Procedures (including critical care time)  Medications Ordered in UC Medications - No data to display  Initial Impression / Assessment and Plan / UC Course  I have reviewed the triage vital signs and the nursing notes.  Pertinent labs & imaging results that were available during my care of the patient were reviewed by me and considered in my medical decision making (see chart for details).   57 y/o female presents for 5 day history of sore throat, congestion, dry cough, bilateral eye redness and drainage.. Seen by PCP 3 days ago and diagnosed with viral pharyngitis.  Currently taking ibuprofen, antibiotic eyedrops and benzonatate without relief.  No fever.  Vitals are all normal and stable patient is overall  well-appearing.  On exam she has nasal congestion, erythema posterior pharynx with clear postnasal drainage, mild diffuse injection of bilateral conjunctiva without drainage.  Chest clear to auscultation.  PCR strep test performed.  Positive.  Reviewed results of patient.  Strep pharyngitis.  Will treat this time with amoxicillin, Promethazine DM for cough, Naphcon-A for eye redness and irritation.  Vies to continue the antibiotic eyedrop as well.  Discussed lidocaine as needed for pain relief.  Reviewed return and ER precautions.   Final Clinical Impressions(s) / UC Diagnoses   Final diagnoses:  Strep pharyngitis  Acute cough  Acute conjunctivitis of both eyes, unspecified acute conjunctivitis type     Discharge Instructions      -Strep test is positive today.  I sent antibiotics to the pharmacy as well as cough medicine and a redness relief eyedrop.  Continue the antibiotic eyedrop as well. - Your symptoms should start to improve significantly over the next 2 to 3 days. - If symptoms worsen, please return for reevaluation.     ED Prescriptions     Medication Sig Dispense Auth. Provider   promethazine-dextromethorphan (PROMETHAZINE-DM) 6.25-15 MG/5ML syrup Take 5 mLs by mouth 4 (four) times daily as needed. 118 mL Eusebio Friendly B, PA-C   amoxicillin (AMOXIL) 500 MG capsule Take 1 capsule (500 mg total) by mouth 2 (two) times daily for 10 days. 20 capsule Eusebio Friendly B, PA-C   naphazoline-pheniramine (NAPHCON-A) 0.025-0.3 % ophthalmic solution Place 1 drop into both eyes 4 (four) times daily as needed for eye irritation. 15 mL Eusebio Friendly B, PA-C   lidocaine (XYLOCAINE) 2 % solution Use as directed 15 mLs in the mouth or throat every 3 (three) hours  as needed for mouth pain (swish and spit). 100 mL Shirlee Latch, PA-C      PDMP not reviewed this encounter.   Shirlee Latch, PA-C 08/01/22 1104

## 2022-08-01 NOTE — Discharge Instructions (Addendum)
-  Strep test is positive today.  I sent antibiotics to the pharmacy as well as cough medicine and a redness relief eyedrop.  Continue the antibiotic eyedrop as well. - Your symptoms should start to improve significantly over the next 2 to 3 days. - If symptoms worsen, please return for reevaluation.

## 2022-10-22 DIAGNOSIS — K279 Peptic ulcer, site unspecified, unspecified as acute or chronic, without hemorrhage or perforation: Secondary | ICD-10-CM | POA: Insufficient documentation

## 2022-11-11 LAB — EXTERNAL GENERIC LAB PROCEDURE: COLOGUARD: NEGATIVE

## 2022-11-11 LAB — COLOGUARD
COLOGUARD: NEGATIVE
Cologuard: NEGATIVE

## 2023-05-10 ENCOUNTER — Other Ambulatory Visit: Payer: Self-pay

## 2023-05-10 DIAGNOSIS — Z1231 Encounter for screening mammogram for malignant neoplasm of breast: Secondary | ICD-10-CM

## 2023-05-16 ENCOUNTER — Ambulatory Visit (INDEPENDENT_AMBULATORY_CARE_PROVIDER_SITE_OTHER): Admitting: Family Medicine

## 2023-05-16 ENCOUNTER — Encounter: Payer: Self-pay | Admitting: Family Medicine

## 2023-05-16 VITALS — BP 137/81 | HR 76 | Temp 98.2°F | Resp 18 | Ht <= 58 in | Wt 177.0 lb

## 2023-05-16 DIAGNOSIS — Z13820 Encounter for screening for osteoporosis: Secondary | ICD-10-CM

## 2023-05-16 DIAGNOSIS — I10 Essential (primary) hypertension: Secondary | ICD-10-CM | POA: Diagnosis not present

## 2023-05-16 DIAGNOSIS — R7303 Prediabetes: Secondary | ICD-10-CM | POA: Diagnosis not present

## 2023-05-16 DIAGNOSIS — Z0000 Encounter for general adult medical examination without abnormal findings: Secondary | ICD-10-CM

## 2023-05-16 DIAGNOSIS — G500 Trigeminal neuralgia: Secondary | ICD-10-CM | POA: Diagnosis not present

## 2023-05-16 DIAGNOSIS — E782 Mixed hyperlipidemia: Secondary | ICD-10-CM

## 2023-05-16 NOTE — Progress Notes (Unsigned)
 New Patient Office Visit  Subjective    Patient ID: Makayla Baker, female    DOB: 10/16/1965  Age: 58 y.o. MRN: [id number]  CC:  Chief Complaint  Patient presents with   Establish Care    HPI Makayla Baker presents to establish care.  Delightful 58 year old with borderline diabetes, history of HTN, PUD, ovarian cyst, Hx trigeminal neuralgia, OSA (not using CPAP), Hx of iron deficiency anemia, vitamin D deficiency.  Last Pap 2 years ago and normal.  Cologuard - 11/05/2022.  Mammogram 04/19/2022.  S/p nasal surgery, s/p bilateral cataract extractions.  Only medicines currently vitamin D2 50,000 units weekly and vitamin C daily. Has not had a DEXA scan PHQ-9 is 0, GAD-7 is 0. No longer taking Tegretol for her atypical face pain that started after wisdom tooth was extracted.  She reports she no longer has pain so has stopped Tegretol.  Additionally Tegretol made her hungry. 02/20/2023 A1c 5.5% down from 5.8%.  Was told she had prediabetes and gave up rice, tortillas and bread.  Also increased her exercise.  She does not want to take medications. 02/20/2023 total cholesterol 165, HDL 50, LDL 96.  10/21/2022 hemoglobin was 14 and iron saturation 24, ferritin 95.     Outpatient Encounter Medications as of 05/16/2023  Medication Sig   Ascorbic Acid (VITAMIN C) 1000 MG tablet Take 1,000 mg by mouth daily.   ergocalciferol (DRISDOL) 1.25 MG (50000 UT) capsule Take 50,000 Units by mouth once a week.   tiZANidine (ZANAFLEX) 2 MG tablet Take 2 mg by mouth 2 (two) times daily.   [DISCONTINUED] benzonatate (TESSALON) 100 MG capsule Take 100 mg by mouth 3 (three) times daily as needed.   [DISCONTINUED] carbamazepine (TEGRETOL XR) 100 MG 12 hr tablet Take 100 mg by mouth daily.   [DISCONTINUED] cyanocobalamin (VITAMIN B12) 1000 MCG tablet Take by mouth. (Patient not taking: Reported on 05/16/2023)   [DISCONTINUED] ergocalciferol (VITAMIN D2) 50000 UNITS capsule Take 50,000 Units by mouth  once a week.   [DISCONTINUED] ferrous sulfate 325 (65 FE) MG EC tablet Take 325 mg by mouth daily with breakfast. (Patient not taking: Reported on 05/16/2023)   [DISCONTINUED] ibuprofen (ADVIL) 600 MG tablet Take by mouth. (Patient not taking: Reported on 05/16/2023)   [DISCONTINUED] ipratropium (ATROVENT) 0.03 % nasal spray SMARTSIG:2 Spray(s) Both Nares 3 Times Daily PRN (Patient not taking: Reported on 05/16/2023)   [DISCONTINUED] lidocaine (XYLOCAINE) 2 % solution Use as directed 15 mLs in the mouth or throat every 3 (three) hours as needed for mouth pain (swish and spit). (Patient not taking: Reported on 05/16/2023)   [DISCONTINUED] naphazoline-pheniramine (NAPHCON-A) 0.025-0.3 % ophthalmic solution Place 1 drop into both eyes 4 (four) times daily as needed for eye irritation. (Patient not taking: Reported on 05/16/2023)   [DISCONTINUED] ondansetron (ZOFRAN-ODT) 4 MG disintegrating tablet Take 1 tablet (4 mg total) by mouth every 8 (eight) hours as needed for nausea or vomiting. (Patient not taking: Reported on 05/16/2023)   [DISCONTINUED] oxyCODONE-acetaminophen (PERCOCET) 5-325 MG tablet Take 1 tablet by mouth every 4 (four) hours as needed. (Patient not taking: Reported on 05/16/2023)   [DISCONTINUED] pantoprazole (PROTONIX) 40 MG tablet Take 40 mg by mouth daily. (Patient not taking: Reported on 05/16/2023)   [DISCONTINUED] promethazine-dextromethorphan (PROMETHAZINE-DM) 6.25-15 MG/5ML syrup Take 5 mLs by mouth 4 (four) times daily as needed. (Patient not taking: Reported on 05/16/2023)   [DISCONTINUED] sertraline (ZOLOFT) 50 MG tablet Take 50 mg by mouth daily. (Patient not taking: Reported on 05/16/2023)  No facility-administered encounter medications on file as of 05/16/2023.    Past Medical History:  Diagnosis Date   Complication of anesthesia    Depression    Headache    Hip pain, chronic    History of orthopnea    Hypertension    H/O NO MEDS NOW   PONV (postoperative nausea and vomiting)     no trouble with cataract surgery   Sleep apnea    NO CPAP    Past Surgical History:  Procedure Laterality Date   CATARACT EXTRACTION W/PHACO Right 04/04/2017   Procedure: CATARACT EXTRACTION PHACO AND INTRAOCULAR LENS PLACEMENT (IOC);  Surgeon: Galen Manila, MD;  Location: [facility] ORS;  Service: Ophthalmology;  Laterality: Right;  [place] 00:30.0 AP% 7.8 CDE 2.33  Fluid Pack Lot # [id number] H   CATARACT EXTRACTION W/PHACO Left 04/18/2017   Procedure: CATARACT EXTRACTION PHACO AND INTRAOCULAR LENS PLACEMENT (IOC);  Surgeon: Galen Manila, MD;  Location: [facility] ORS;  Service: Ophthalmology;  Laterality: Left;  [place] 00:18.6 AP% 7.5 CDE 1.39 Fluid Pack Lot # [id number]   NASAL SINUS SURGERY      Family History  Problem Relation Age of Onset   Breast cancer Neg Hx     Social History   Socioeconomic History   Marital status: Divorced    Spouse name: Not on file   Number of children: 3   Years of education: Not on file   Highest education level: 6th grade  Occupational History   Not on file  Tobacco Use   Smoking status: Never    Passive exposure: Never   Smokeless tobacco: Never  Vaping Use   Vaping status: Never Used  Substance and Sexual Activity   Alcohol use: No   Drug use: Never   Sexual activity: Yes    Birth control/protection: Post-menopausal  Other Topics Concern   Not on file  Social History Narrative   Not on file   Social Drivers of Health   Financial Resource Strain: Low Risk  (05/09/2022)   [facility]   Overall Financial Resource Strain (CARDIA)    Difficulty of Paying Living Expenses: Not very hard  Food Insecurity: No Food Insecurity (04/05/2022)   Hunger Vital Sign    Worried About Running Out of Food in the Last Year: Never true    Ran Out of Food in the Last Year: Never true  Transportation Needs: No Transportation Needs (04/05/2022)   PRAPARE - Administrator, Civil Service (Medical): No    Lack of  Transportation (Non-Medical): No  Physical Activity: Not on file  Stress: Not on file  Social Connections: Not on file  Intimate Partner Violence: Not on file    ROS      Objective   BP 137/81 (BP Location: Left Arm, Patient Position: Sitting, Cuff Size: Large)   Pulse 76   Temp 98.2 F (36.8 C) (Oral)   Resp 18   Ht 4\' 10"  (1.473 m)   Wt 177 lb (80.3 kg)   LMP 11/25/2014 (Approximate)   SpO2 96%   BMI 36.99 kg/m    Physical Exam Vitals and nursing note reviewed.  Constitutional:      Appearance: Normal appearance.  HENT:     Head: Normocephalic and atraumatic.  Eyes:     Conjunctiva/sclera: Conjunctivae normal.  Cardiovascular:     Rate and Rhythm: Normal rate and regular rhythm.  Pulmonary:     Effort: Pulmonary effort is normal.  Breath sounds: Normal breath sounds.  Musculoskeletal:     Right lower leg: No edema.     Left lower leg: No edema.  Skin:    General: Skin is warm and dry.  Neurological:     Mental Status: She is alert and oriented to person, place, and time.  Psychiatric:        Mood and Affect: Mood normal.        Behavior: Behavior normal.        Thought Content: Thought content normal.        Judgment: Judgment normal.            The 10-year ASCVD risk score (Arnett DK, et al., 2019) is: 2.8%     Assessment & Plan:  Screening for osteoporosis -     DG Bone Density; Future  Trigeminal neuralgia syndrome Assessment & Plan: Started after a wisdom tooth was extracted.  Has improved and is no longer taking Tegretol.   Prediabetes Assessment & Plan: A1c down to 5.5%.  Has modified her diet and given up carbohydrates.  Discussed exercise and watching her weight.   Benign essential hypertension Assessment & Plan: Has lost a little weight and her blood pressures are now reasonable.  Recheck today 137/81.  Discussed salt in her diet and daily exercise.   Hyperlipidemia, mixed Assessment & Plan: 02/20/2023 total cholesterol  165, HDL 50 and LDL 96.  Current ASCVD 10-year risk 3.3%.  Should she develop diabetes the goal would be to get her LDL below 70.  At her current status it is acceptable not to take a statin..     Return in about 6 months (around 11/16/2023) for BP recheck, A1c.   Alease Medina, MD

## 2023-05-17 ENCOUNTER — Encounter: Payer: Self-pay | Admitting: Family Medicine

## 2023-05-17 DIAGNOSIS — G500 Trigeminal neuralgia: Secondary | ICD-10-CM | POA: Insufficient documentation

## 2023-05-17 DIAGNOSIS — Z0000 Encounter for general adult medical examination without abnormal findings: Secondary | ICD-10-CM | POA: Insufficient documentation

## 2023-05-17 DIAGNOSIS — Z13820 Encounter for screening for osteoporosis: Secondary | ICD-10-CM | POA: Insufficient documentation

## 2023-05-17 NOTE — Assessment & Plan Note (Signed)
 Has lost a little weight and her blood pressures are now reasonable.  Recheck today 137/81.  Discussed salt in her diet and daily exercise.

## 2023-05-17 NOTE — Assessment & Plan Note (Signed)
 02/20/2023 total cholesterol 165, HDL 50 and LDL 96.  Current ASCVD 10-year risk 3.3%.  Should she develop diabetes the goal would be to get her LDL below 70.  At her current status it is acceptable not to take a statin.Makayla Baker

## 2023-05-17 NOTE — Assessment & Plan Note (Signed)
 Started after a wisdom tooth was extracted.  Has improved and is no longer taking Tegretol.

## 2023-05-17 NOTE — Assessment & Plan Note (Addendum)
 Never had a DEXA scan.  Will get this arranged for her.  Last mammogram 04/19/2022.  Will arrange screening mammogram.

## 2023-05-17 NOTE — Assessment & Plan Note (Signed)
 A1c down to 5.5%.  Has modified her diet and given up carbohydrates.  Discussed exercise and watching her weight.

## 2023-05-29 ENCOUNTER — Ambulatory Visit: Payer: BLUE CROSS/BLUE SHIELD

## 2023-05-30 ENCOUNTER — Encounter: Payer: Self-pay | Admitting: Family Medicine

## 2023-05-30 ENCOUNTER — Ambulatory Visit (INDEPENDENT_AMBULATORY_CARE_PROVIDER_SITE_OTHER): Admitting: Family Medicine

## 2023-05-30 VITALS — BP 149/87 | HR 78 | Temp 97.9°F | Resp 18 | Ht <= 58 in | Wt 174.0 lb

## 2023-05-30 DIAGNOSIS — J309 Allergic rhinitis, unspecified: Secondary | ICD-10-CM | POA: Insufficient documentation

## 2023-05-30 DIAGNOSIS — J302 Other seasonal allergic rhinitis: Secondary | ICD-10-CM

## 2023-05-30 MED ORDER — PREDNISONE 20 MG PO TABS
20.0000 mg | ORAL_TABLET | Freq: Every day | ORAL | 0 refills | Status: DC
Start: 2023-05-30 — End: 2023-09-15

## 2023-05-30 NOTE — Assessment & Plan Note (Signed)
 She is still symptomatic on claritin D.  Gave her information on eye drops that will be beneficial for JWJ:XBJYNWG and Patanol.  Also gave her steriod burst: 20mg  prednisone for 5 days.  FU next week because she may get a secondary bacterial sinus infection.

## 2023-05-30 NOTE — Progress Notes (Signed)
   Established Patient Office Visit  Subjective   Patient ID: Makayla Baker, female    DOB: February 12, 1966  Age: 58 y.o. MRN: [id number]  Chief Complaint  Patient presents with   Allergies    HPI Her allergies are bothering her.  She is taking Claritin D and not getting much relief.  She has head congestion and clear rhinorrhea.  She has also tried Carlos first and got no benefit.  She denies a fever,  ST and PND.  Her eyes itch and her face itches.  She denies having a cough, SOB or wheezing.       ROS    Objective:     BP (!) 149/87 (BP Location: Right Arm, Patient Position: Sitting, Cuff Size: Normal)   Pulse 78   Temp 97.9 F (36.6 C) (Oral)   Resp 18   Ht 4\' 10"  (1.473 m)   Wt 174 lb (78.9 kg)   LMP 11/25/2014 (Approximate)   SpO2 94%   BMI 36.37 kg/m    Physical Exam Vitals and nursing note reviewed.  Constitutional:      Appearance: Normal appearance.  HENT:     Head: Normocephalic and atraumatic.     Nose: Congestion and rhinorrhea present. Rhinorrhea is clear.     Right Turbinates: Swollen and pale.     Left Turbinates: Swollen and pale.  Eyes:     Conjunctiva/sclera: Conjunctivae normal.  Cardiovascular:     Rate and Rhythm: Normal rate and regular rhythm.  Pulmonary:     Effort: Pulmonary effort is normal.     Breath sounds: Normal breath sounds.  Musculoskeletal:     Right lower leg: No edema.     Left lower leg: No edema.  Skin:    General: Skin is warm and dry.  Neurological:     Mental Status: She is alert and oriented to person, place, and time.  Psychiatric:        Mood and Affect: Mood normal.        Behavior: Behavior normal.        Thought Content: Thought content normal.        Judgment: Judgment normal.          No results found for any visits on 05/30/23.    The 10-year ASCVD risk score (Arnett DK, et al., 2019) is: 3.3%    Assessment & Plan:  Seasonal allergic rhinitis, unspecified trigger Assessment & Plan: She  is still symptomatic on claritin D.  Gave her information on eye drops that will be beneficial for WUJ:WJXBJYN and Patanol.  Also gave her steriod burst: 20mg  prednisone for 5 days.  FU next week because she may get a secondary bacterial sinus infection.  Orders: -     predniSONE; Take 1 tablet (20 mg total) by mouth daily with breakfast.  Dispense: 5 tablet; Refill: 0     Return in about 10 days (around 06/09/2023).    Alease Medina, MD

## 2023-06-08 ENCOUNTER — Ambulatory Visit: Payer: Self-pay

## 2023-06-08 ENCOUNTER — Ambulatory Visit
Admission: EM | Admit: 2023-06-08 | Discharge: 2023-06-08 | Disposition: A | Discharge status: Home / Self Care | Attending: Family Medicine | Admitting: Family Medicine

## 2023-06-08 DIAGNOSIS — R5383 Other fatigue: Secondary | ICD-10-CM | POA: Diagnosis present

## 2023-06-08 DIAGNOSIS — R6889 Other general symptoms and signs: Secondary | ICD-10-CM | POA: Diagnosis present

## 2023-06-08 LAB — GLUCOSE, CAPILLARY: Glucose-Capillary: 120 mg/dL — ABNORMAL HIGH (ref 70–99)

## 2023-06-08 LAB — RESP PANEL BY RT-PCR (FLU A&B, COVID) ARPGX2
Influenza A by PCR: NEGATIVE
Influenza B by PCR: NEGATIVE
SARS Coronavirus 2 by RT PCR: NEGATIVE

## 2023-06-08 NOTE — Telephone Encounter (Signed)
  Chief Complaint: weakness Symptoms: nausea, diarrhea, weakness, dizziness when pt moves head Frequency: about a week Pertinent Negatives: Patient denies fever, SOB, CP, unilateral weakness, speech changes, vision changes,  Disposition: [] ED /[x] Urgent Care (no appt availability in office) / [] Appointment(In office/virtual)/ []  [facility]/ [] Home Care/ [] Refused Recommended Disposition /[] [facility]/ []  Follow-up with PCP Additional Notes: Pt states that she was given prednisone about 2 weeks ago. Pt states that it was finished and she started feeling weak, nauseous, diarrhea, and dizzy when she turns her head. Pt states that she has been drinking fluid, but states that her appetite has been poor. Pt states that she would like an appt today instead of tomorrow d/t how she is feeling. Pt was advised UC as no appts within office. Pt states that she is at work and will see how she feels once she is done with work.  Copied from CRM [id number]. Topic: Clinical - Red Word Triage >> Jun 08, 2023  8:10 AM Fonda Kinder J wrote: [facility] that prompted transfer to Nurse Triage: dizziness,fatigue,nausea Reason for Disposition  [1] MODERATE weakness (i.e., interferes with work, school, normal activities) AND [2] cause unknown  (Exceptions: Weakness from acute minor illness or poor fluid intake; weakness is chronic and not worse.)  Answer Assessment - Initial Assessment Questions 1. DESCRIPTION: "Describe how you are feeling."     Weakness, nausea, diarrhea, dizziness 2. SEVERITY: "How bad is it?"  "Can you stand and walk?"   - MILD (0-3): Feels weak or tired, but does not interfere with work, school or normal activities.   - MODERATE (4-7): Able to stand and walk; weakness interferes with work, school, or normal activities.   - SEVERE (8-10): Unable to stand or walk; unable to do usual activities.     8 3. ONSET: "When did these symptoms begin?" (e.g., hours, days, weeks, months)     About  a week 4. CAUSE: "What do you think is causing the weakness or fatigue?" (e.g., not drinking enough fluids, medical problem, trouble sleeping)     The medicine that the doctor gave, pt states that she was given a medication for inflammation 5. NEW MEDICINES:  "Have you started on any new medicines recently?" (e.g., opioid pain medicines, benzodiazepines, muscle relaxants, antidepressants, antihistamines, neuroleptics, beta blockers)     prednisone 6. OTHER SYMPTOMS: "Do you have any other symptoms?" (e.g., chest pain, fever, cough, SOB, vomiting, diarrhea, bleeding, other areas of pain)     Diarrhea,  Protocols used: Weakness (Generalized) and Fatigue-A-AH

## 2023-06-08 NOTE — ED Triage Notes (Addendum)
 Patient presents with nausea,weakness and upset stomach x day 3. Patient states she was only able to drink rice, coconut water for 2 days, she was able to eat 2 eggs and yogurt today. Pt states she started feeling weak 2 weeks ago, saw her PCP, prescribed Prednisone, as she had nasal inflammation. Pt states she also used Pataday and Flonase. Patient states she had chills and felt like she had a fever Tuesday and Wednesday night.

## 2023-06-08 NOTE — Discharge Instructions (Addendum)
 Your COVID and influenza tests are negative. Your blood sugar was 120. Your EKG was not concerning for a heart attack.    Follow up with your primary care provider as scheduled..  You may need your thyroid and B12 checked when you get your blood work tomorrow.

## 2023-06-08 NOTE — ED Provider Notes (Addendum)
 [facility]    CSN: [id number] Arrival date & time: 06/08/23  1548      History   Chief Complaint Chief Complaint  Patient presents with   Nausea    HPI Makayla Baker is a 58 y.o. female.   HPI  History obtained from the patient. Makayla Baker presents for nausea, fatigue, intermittent dizziness since Tuesday.  Has had a decreased appetite and diarrhea. No cough. Endorses rhinorrhea, chills and fever.  No vomiting. Has been taking prednisone, Flonase and Pataday for 5 days which was prescribed by her PCP. Finished the prednisone last Saturday.   She has an appointment to be see at PCP office tomorrow.  She called their office to see if they could see her today and the nurse told her to go to the urgent care or ED to see if it was her heart.   Denies chest pain and shortness of breath.      Past Medical History:  Diagnosis Date   Complication of anesthesia    Depression    Headache    Hip pain, chronic    History of orthopnea    Hypertension    H/O NO MEDS NOW   PONV (postoperative nausea and vomiting)    no trouble with cataract surgery   Sleep apnea    NO CPAP    Patient Active Problem List   Diagnosis Date Noted   Fatigue 06/09/2023   Dizziness 06/09/2023   Allergic rhinitis 05/30/2023   Trigeminal neuralgia syndrome 05/17/2023   Healthcare maintenance 05/17/2023   PUD (peptic ulcer disease) 10/22/2022   Prediabetes 05/09/2022   Anxiety 05/03/2022   Asthma without status asthmaticus 05/03/2022   B12 deficiency 12/02/2021   Vitamin D deficiency 05/28/2019   Lumbar stenosis with neurogenic claudication 02/05/2014   Depression 11/13/2013   Anemia 08/09/2013   Osteoarthritis of hip, unspecified 07/30/2013   Benign essential hypertension 06/04/2013   Chronic tension-type headache, intractable 06/04/2013   Hyperlipidemia, mixed 06/04/2013   Unspecified ovarian cyst, unspecified side 06/04/2013    Past Surgical History:  Procedure Laterality  Date   CATARACT EXTRACTION W/PHACO Right 04/04/2017   Procedure: CATARACT EXTRACTION PHACO AND INTRAOCULAR LENS PLACEMENT (IOC);  Surgeon: Galen Manila, MD;  Location: [facility] ORS;  Service: Ophthalmology;  Laterality: Right;  [place] 00:30.0 AP% 7.8 CDE 2.33  Fluid Pack Lot # [id number]   CATARACT EXTRACTION W/PHACO Left 04/18/2017   Procedure: CATARACT EXTRACTION PHACO AND INTRAOCULAR LENS PLACEMENT (IOC);  Surgeon: Galen Manila, MD;  Location: [facility] ORS;  Service: Ophthalmology;  Laterality: Left;  [place] 00:18.6 AP% 7.5 CDE 1.39 Fluid Pack Lot # [id number]   NASAL SINUS SURGERY      OB History   No obstetric history on file.      Home Medications    Prior to Admission medications   Medication Sig Start Date End Date Taking? Authorizing Provider  Ascorbic Acid (VITAMIN C) 1000 MG tablet Take 1,000 mg by mouth daily.    [provider]  azelastine (ASTELIN) 0.1 % nasal spray Place 1 spray into both nostrils 2 (two) times daily.    [provider]  ergocalciferol (DRISDOL) 1.25 MG (50000 UT) capsule Take 50,000 Units by mouth once a week. 12/02/21   [provider]  ketotifen (ZADITOR) 0.035 % ophthalmic solution Place 1 drop into both eyes in the morning and at bedtime.    [provider]  levocetirizine (XYZAL) 5 MG tablet Take 5 mg by mouth every evening.    [provider]  loratadine-pseudoephedrine (CLARITIN-D 12-HOUR) 5-120 MG tablet Take 1 tablet by mouth 2 (two) times daily.    [provider]  meclizine (ANTIVERT) 25 MG tablet Take 1 tablet (25 mg total) by mouth 3 (three) times daily as needed for dizziness. 06/09/23   Ziglar, Susan K, MD  predniSONE (DELTASONE) 20 MG tablet Take 1 tablet (20 mg total) by mouth daily with breakfast. Patient not taking: Reported on 06/09/2023 05/30/23   Ziglar, Susan K, MD  tiZANidine (ZANAFLEX) 2 MG tablet Take 2 mg by mouth 2 (two) times daily. 12/27/22   [provider]    Family  History Family History  Problem Relation Age of Onset   Breast cancer Neg Hx     Social History Social History   Tobacco Use   Smoking status: Never    Passive exposure: Never   Smokeless tobacco: Never  Vaping Use   Vaping status: Never Used  Substance Use Topics   Alcohol use: No   Drug use: Never     Allergies   Dexamethasone and Hydrocodone-acetaminophen   Review of Systems Review of Systems: negative unless otherwise stated in HPI.      Physical Exam Triage Vital Signs ED Triage Vitals  Encounter Vitals Group     BP 06/08/23 1612 116/80     Systolic BP Percentile --      Diastolic BP Percentile --      Pulse Rate 06/08/23 1612 72     Resp 06/08/23 1612 16     Temp 06/08/23 1612 98.7 F (37.1 C)     Temp Source 06/08/23 1612 Oral     SpO2 06/08/23 1612 97 %     Weight 06/08/23 1609 175 lb (79.4 kg)     Height 06/08/23 1609 4\' 10"  (1.473 m)     Head Circumference --      Peak Flow --      Pain Score 06/08/23 1609 0     Pain Loc --      Pain Education --      Exclude from Growth Chart --    No data found.  Updated Vital Signs BP 116/80 (BP Location: Left Arm)   Pulse 72   Temp 98.7 F (37.1 C) (Oral)   Resp 16   Ht 4\' 10"  (1.473 m)   Wt 79.4 kg   LMP 11/25/2014 (Approximate)   SpO2 97%   BMI 36.58 kg/m   Visual Acuity Right Eye Distance:   Left Eye Distance:   Bilateral Distance:    Right Eye Near:   Left Eye Near:    Bilateral Near:     Physical Exam GEN:     alert, ill but non-toxic appearing female in no distress    HENT:  mucus membranes moist, oropharyngeal without lesions or erythema, no tonsillar hypertrophy or exudates, no nasal discharge, bilateral TM normal EYES:   no scleral injection or discharge NECK: Good range of motion, no goiter,no meningismus   RESP:  no increased work of breathing, clear to auscultation bilaterally CVS:   regular rate and rhythm ABD:     Soft, nontender, non-distended NEURO:  alert, oriented,  speech normal, CN 2-12 grossly intact, no facial droop,  sensation grossly intact, strength 5/5 bilateral UE and LE, normal coordination, steady gait Skin:   warm and dry    UC Treatments / Results  Labs (all labs ordered are listed, but only abnormal results are displayed) Labs Reviewed  GLUCOSE, CAPILLARY - Abnormal; Notable  for the following components:      Result Value   Glucose-Capillary 120 (*)    All other components within normal limits  RESP PANEL BY RT-PCR (FLU A&B, COVID) [id number]    EKG   Radiology No results found.  Procedures Procedures (including critical care time)  Medications Ordered in UC Medications - No data to display  Initial Impression / Assessment and Plan / UC Course  I have reviewed the triage vital signs and the nursing notes.  Pertinent labs & imaging results that were available during my care of the patient were reviewed by me and considered in my medical decision making (see chart for details).       Pt is a 58 y.o. female who presents for 3 days of fatigue, nausea and dizziness.   Lashanda is afebrile here.  Vital signs stable.  Satting well on room air. Overall pt is non-toxic appearing, well hydrated, without respiratory distress.  Neurologic, abdominal and cardiopulmonary exams are unremarkable.  COVID and influenza panel obtained and was negative.  Discussed getting blood work here today including a TSH, B12, CBC and BMP.  Pt declined blood work today but agreeable to having blood sugar checked. She is worried about her heart and requests EKG based on her conversation with nurse.  Reviewed nurse note from earlier today where patient was describing flulike symptoms with dizziness.  Patient recently completed a course of prednisone for seasonal allergic rhinitis per the progress notes from 05/30/2023.  She was advised to go to the urgent care.  Given her dizziness and nausea a CBG was obtained and this was also normal. Patient very concerned that  this could be her heart therefore EKG obtained.  EKG personally interpreted by me; NSR without acute ST or T wave changes; compared to 07/22/2021 T wave abnormality has resolved.  Patient denying chest pain and shortness of breath.   I do not think her symptoms are cardiopulmonary related.  Reassurance provided.  Patient to follow-up with her primary care provider as scheduled.  Return and ED precautions given and voiced understanding. Discussed MDM, treatment plan and plan for follow-up with patient who agrees with plan.     Final Clinical Impressions(s) / UC Diagnoses   Final diagnoses:  Other fatigue  Flu-like symptoms     Discharge Instructions      Your COVID and influenza tests are negative. Your blood sugar was 120. Your EKG was not concerning for a heart attack.    Follow up with your primary care provider as scheduled..  You may need your thyroid and B12 checked when you get your blood work tomorrow.     ED Prescriptions   None    PDMP not reviewed this encounter.      Alexei Ey, DO 06/15/23 [phone]

## 2023-06-09 ENCOUNTER — Encounter: Payer: Self-pay | Admitting: Family Medicine

## 2023-06-09 ENCOUNTER — Ambulatory Visit (INDEPENDENT_AMBULATORY_CARE_PROVIDER_SITE_OTHER): Admitting: Family Medicine

## 2023-06-09 VITALS — BP 146/84 | HR 72 | Temp 98.0°F | Resp 18 | Ht <= 58 in | Wt 175.0 lb

## 2023-06-09 DIAGNOSIS — R5383 Other fatigue: Secondary | ICD-10-CM | POA: Diagnosis not present

## 2023-06-09 DIAGNOSIS — R42 Dizziness and giddiness: Secondary | ICD-10-CM

## 2023-06-09 MED ORDER — MECLIZINE HCL 25 MG PO TABS
25.0000 mg | ORAL_TABLET | Freq: Three times a day (TID) | ORAL | 0 refills | Status: AC | PRN
Start: 2023-06-09 — End: ?

## 2023-06-09 NOTE — Assessment & Plan Note (Signed)
 Fatigue does not appear to be depression.  Asked her to increase water intake.  Reports she is sleeping well but she has untreated OSA.  Checking CMP, CBC, thyroid panel and lipids.  Return to clinic if you develop any other symptoms.

## 2023-06-09 NOTE — Progress Notes (Signed)
 Established Patient Office Visit  Subjective   Patient ID: Makayla Baker, female    DOB: 1965-09-18  Age: 58 y.o. MRN: [id number]  Chief Complaint  Patient presents with   Medical Management of Chronic Issues   Fatigue    After completing prednisone.    HPI  57 yo delightful woman with borderline diabetes, Hx anemia, anxiety/depression, asthma, vitamin B12 and D deficiencies, tension headaches, trigeminal neuralgia, mixed hyperlipidemia, OSA (not on CPAP)    Seen 05/30/2023 with allergies.  Given a prednisone burst of 20 mg a day for 5 days and told to follow-up if she developed a bacterial sinus infection.  Reports no sinus congestion and no PND.  Went to urgent care yesterday complaining of fatigue, nausea, intermittent dizziness, increased appetite and diarrhea.  She is felt dizzy with movement of her head, lying down and turning over for about a week.  No vomiting.  No fever.  No dysuria or frequency.  No shortness of breath or chest pains.  Denies headache.  No peripheral edema.  No cough and no wheezing.  EKG was normal without signs of ischemia.  Testing for COVID/flu A and B was negative.  She declined blood work.  Fingerstick glucose 120.      ROS    Objective:     BP (!) 146/84 (BP Location: Left Arm, Patient Position: Sitting, Cuff Size: Normal)   Pulse 72   Temp 98 F (36.7 C) (Oral)   Resp 18   Ht 4\' 10"  (1.473 m)   Wt 175 lb (79.4 kg)   LMP 11/25/2014 (Approximate)   SpO2 96%   BMI 36.58 kg/m    Physical Exam Vitals and nursing note reviewed.  Constitutional:      Appearance: Normal appearance.  HENT:     Head: Normocephalic and atraumatic.  Eyes:     Conjunctiva/sclera: Conjunctivae normal.  Cardiovascular:     Rate and Rhythm: Normal rate and regular rhythm.  Pulmonary:     Effort: Pulmonary effort is normal.     Breath sounds: Normal breath sounds.  Musculoskeletal:     Right lower leg: No edema.     Left lower leg: No edema.  Skin:     General: Skin is warm and dry.  Neurological:     Mental Status: She is alert and oriented to person, place, and time.  Psychiatric:        Mood and Affect: Mood normal.        Behavior: Behavior normal.        Thought Content: Thought content normal.        Judgment: Judgment normal.          Results for orders placed or performed in visit on 06/09/23  HM PAP SMEAR  Result Value Ref Range   HM Pap smear Negative       The 10-year ASCVD risk score (Arnett DK, et al., 2019) is: 3.1%    Assessment & Plan:  Other fatigue Assessment & Plan:  Fatigue does not appear to be depression.  Asked her to increase water intake.  Reports she is sleeping well but she has untreated OSA.  Checking CMP, CBC, thyroid panel and lipids.  Return to clinic if you develop any other symptoms.  Orders: -     CBC with Differential/Platelet -     Comprehensive metabolic panel with GFR -     Lipid panel -     TSH + free T4  Dizziness Assessment &  Plan: Sounds like benign positional vertigo.  Ear exam was normal bilaterally.  Trial of meclizine 25 mg 3 times daily.  If dizziness does not improve in a week or worsens we will refer to ENT for vestibular testing.  Orders: -     Meclizine HCl; Take 1 tablet (25 mg total) by mouth 3 (three) times daily as needed for dizziness.  Dispense: 30 tablet; Refill: 0     Return in about 3 months (around 09/08/2023).    Alease Medina, MD

## 2023-06-09 NOTE — Assessment & Plan Note (Signed)
 Sounds like benign positional vertigo.  Ear exam was normal bilaterally.  Trial of meclizine 25 mg 3 times daily.  If dizziness does not improve in a week or worsens we will refer to ENT for vestibular testing.

## 2023-06-10 LAB — CBC WITH DIFFERENTIAL/PLATELET
Basophils Absolute: 0 10*3/uL (ref 0.0–0.2)
Basos: 0 %
EOS (ABSOLUTE): 0.3 10*3/uL (ref 0.0–0.4)
Eos: 4 %
Hematocrit: 44 % (ref 34.0–46.6)
Hemoglobin: 14.8 g/dL (ref 11.1–15.9)
Immature Grans (Abs): 0 10*3/uL (ref 0.0–0.1)
Immature Granulocytes: 0 %
Lymphocytes Absolute: 2 10*3/uL (ref 0.7–3.1)
Lymphs: 31 %
MCH: 32.3 pg (ref 26.6–33.0)
MCHC: 33.6 g/dL (ref 31.5–35.7)
MCV: 96 fL (ref 79–97)
Monocytes Absolute: 0.5 10*3/uL (ref 0.1–0.9)
Monocytes: 8 %
Neutrophils Absolute: 3.7 10*3/uL (ref 1.4–7.0)
Neutrophils: 57 %
Platelets: 195 10*3/uL (ref 150–450)
RBC: 4.58 x10E6/uL (ref 3.77–5.28)
RDW: 13 % (ref 11.7–15.4)
WBC: 6.5 10*3/uL (ref 3.4–10.8)

## 2023-06-10 LAB — LIPID PANEL
Chol/HDL Ratio: 3.5 ratio (ref 0.0–4.4)
Cholesterol, Total: 160 mg/dL (ref 100–199)
HDL: 46 mg/dL (ref >39)
LDL Chol Calc (NIH): 93 mg/dL (ref 0–99)
Triglycerides: 116 mg/dL (ref 0–149)
VLDL Cholesterol Cal: 21 mg/dL (ref 5–40)

## 2023-06-10 LAB — TSH+FREE T4
Free T4: 1.02 ng/dL (ref 0.82–1.77)
TSH: 3.61 u[IU]/mL (ref 0.450–4.500)

## 2023-06-10 LAB — COMPREHENSIVE METABOLIC PANEL WITH GFR
ALT: 26 IU/L (ref 0–32)
AST: 28 IU/L (ref 0–40)
Albumin: 4.1 g/dL (ref 3.8–4.9)
Alkaline Phosphatase: 93 IU/L (ref 44–121)
BUN/Creatinine Ratio: 35 — ABNORMAL HIGH (ref 9–23)
BUN: 17 mg/dL (ref 6–24)
Bilirubin Total: 0.3 mg/dL (ref 0.0–1.2)
CO2: 20 mmol/L (ref 20–29)
Calcium: 8.9 mg/dL (ref 8.7–10.2)
Chloride: 107 mmol/L — ABNORMAL HIGH (ref 96–106)
Creatinine, Ser: 0.48 mg/dL — ABNORMAL LOW (ref 0.57–1.00)
Globulin, Total: 2.4 g/dL (ref 1.5–4.5)
Glucose: 106 mg/dL — ABNORMAL HIGH (ref 70–99)
Potassium: 4 mmol/L (ref 3.5–5.2)
Sodium: 144 mmol/L (ref 134–144)
Total Protein: 6.5 g/dL (ref 6.0–8.5)
eGFR: 110 mL/min/{1.73_m2} (ref >59)

## 2023-06-12 ENCOUNTER — Encounter: Payer: Self-pay | Admitting: Family Medicine

## 2023-07-12 ENCOUNTER — Ambulatory Visit
Admission: RE | Admit: 2023-07-12 | Discharge: 2023-07-12 | Disposition: A | Discharge status: Home / Self Care | Source: Ambulatory Visit | Attending: Family Medicine | Admitting: Family Medicine

## 2023-07-12 DIAGNOSIS — Z13820 Encounter for screening for osteoporosis: Secondary | ICD-10-CM | POA: Diagnosis present

## 2023-07-12 DIAGNOSIS — Z780 Asymptomatic menopausal state: Secondary | ICD-10-CM | POA: Insufficient documentation

## 2023-07-14 ENCOUNTER — Ambulatory Visit: Payer: Self-pay | Admitting: Family Medicine

## 2023-07-19 ENCOUNTER — Telehealth: Payer: Self-pay | Admitting: Family Medicine

## 2023-07-19 NOTE — Telephone Encounter (Signed)
 Patient walked in to request refill of:  Name of Medication(s):  Vitamin D Last date of OV:  06/09/23 Pharmacy:  change to [facility]  on [address]   Will route refill request to Clinic RN.  Discussed with patient policy to call pharmacy for future refills.  Also, discussed refills may take up to 48 hours to approve or deny.  Makayla Baker

## 2023-07-20 ENCOUNTER — Other Ambulatory Visit: Payer: Self-pay | Admitting: Family Medicine

## 2023-07-20 DIAGNOSIS — E559 Vitamin D deficiency, unspecified: Secondary | ICD-10-CM

## 2023-07-25 ENCOUNTER — Ambulatory Visit

## 2023-07-25 DIAGNOSIS — E559 Vitamin D deficiency, unspecified: Secondary | ICD-10-CM

## 2023-07-26 ENCOUNTER — Other Ambulatory Visit: Payer: Self-pay | Admitting: Family Medicine

## 2023-07-26 ENCOUNTER — Ambulatory Visit: Payer: Self-pay | Admitting: Family Medicine

## 2023-07-26 LAB — VITAMIN D 25 HYDROXY (VIT D DEFICIENCY, FRACTURES): Vit D, 25-Hydroxy: 48.6 ng/mL (ref 30.0–100.0)

## 2023-07-26 MED ORDER — ERGOCALCIFEROL 1.25 MG (50000 UT) PO CAPS: 50000.0000 [IU] | ORAL_CAPSULE | ORAL | 1 refills | Status: DC

## 2023-09-08 ENCOUNTER — Ambulatory Visit: Admitting: Family Medicine

## 2023-09-15 ENCOUNTER — Encounter: Payer: Self-pay | Admitting: Family Medicine

## 2023-09-15 ENCOUNTER — Ambulatory Visit (INDEPENDENT_AMBULATORY_CARE_PROVIDER_SITE_OTHER): Admitting: Family Medicine

## 2023-09-15 VITALS — BP 147/81 | HR 64 | Temp 98.1°F | Resp 18 | Ht <= 58 in | Wt 175.0 lb

## 2023-09-15 DIAGNOSIS — I10 Essential (primary) hypertension: Secondary | ICD-10-CM

## 2023-09-15 DIAGNOSIS — Z13820 Encounter for screening for osteoporosis: Secondary | ICD-10-CM

## 2023-09-15 DIAGNOSIS — K760 Fatty (change of) liver, not elsewhere classified: Secondary | ICD-10-CM | POA: Insufficient documentation

## 2023-09-15 MED ORDER — HYDROCHLOROTHIAZIDE 12.5 MG PO TABS: 12.5000 mg | ORAL_TABLET | Freq: Every day | ORAL | 3 refills | Status: DC

## 2023-09-15 NOTE — Assessment & Plan Note (Signed)
 06/09/2023 labs LFTs are normal.  Will check an ultrasound of her liver.

## 2023-09-15 NOTE — Assessment & Plan Note (Signed)
 She had taken blood pressure medication in the past but chart notes that she has to be taken off of it.  She expressed concerns today that she can get addicted to blood pressure medicine explained that that is not possible.  Explained the risk for stroke and heart attack.  Start HCTZ 12.5 mg daily.  Please check your blood pressure at home follow-up in a month.

## 2023-09-15 NOTE — Assessment & Plan Note (Signed)
 07/12/2023 DEXA F in T equals -0.5, lumbar T equals -0.3.  She has healthy bones.

## 2023-09-15 NOTE — Progress Notes (Signed)
 Established Patient Office Visit  Subjective   Patient ID: Makayla Baker, female    DOB: 12/25/1965  Age: 58 y.o. MRN: [id number]  Chief Complaint  Patient presents with   Medical Management of Chronic Issues    HPI 58 yo delightful woman with borderline diabetes, Hx anemia, anxiety/depression, asthma, vitamin B12 and D deficiencies, tension headaches, trigeminal neuralgia, mixed hyperlipidemia, OSA (not on CPAP) and HTN, colonoscopy 12/15/2015, DEXA 07/12/2023 (FN -0.5, L -0.3)  Discussed the use of AI scribe software for clinical note transcription with the patient, who gave verbal consent to proceed.  History of Present Illness   Makayla Baker is a 58 year old female who presents with concerns about high blood pressure and a history of fatty liver.  She does not monitor her blood pressure at home but has taken BP medication in the past.  Three years ago she was on hydrochlorothiazide, hydrochlorothiazide-losartan.  She feels nervous about starting a new job soon, which she believes might be contributing to her elevated blood pressure.  She has a history of dizziness, BPV, which has since resolved. She recalls taking meclizine , which may have helped alleviate the dizziness.  She is concerned about her liver, mentioning a past diagnosis of fatty liver from years ago when she was seen at [facility]. She is unsure about how to manage this condition and finds it difficult to lose weight.  She reports a decreased sex drive, which she attributes to menopause.  This has affected her relationship with her husband, who feels she does not love him anymore.      ROS    Objective:     BP (!) 147/81 (BP Location: Left Arm, Patient Position: Sitting, Cuff Size: Normal)   Pulse 64   Temp 98.1 F (36.7 C) (Oral)   Resp 18   Ht 4' 10 (1.473 m)   Wt 175 lb (79.4 kg)   LMP 11/25/2014 (Approximate)   SpO2 95%   BMI 36.58 kg/m    Physical Exam Vitals and nursing note  reviewed.  Constitutional:      Appearance: Normal appearance.  HENT:     Head: Normocephalic and atraumatic.  Eyes:     Conjunctiva/sclera: Conjunctivae normal.  Cardiovascular:     Rate and Rhythm: Normal rate and regular rhythm.  Pulmonary:     Effort: Pulmonary effort is normal.     Breath sounds: Normal breath sounds.  Abdominal:     General: Abdomen is flat. Bowel sounds are normal.     Palpations: Abdomen is soft. There is no hepatomegaly or splenomegaly.     Tenderness: There is no abdominal tenderness.  Musculoskeletal:     Right lower leg: No edema.     Left lower leg: No edema.  Skin:    General: Skin is warm and dry.  Neurological:     Mental Status: She is alert and oriented to person, place, and time.  Psychiatric:        Mood and Affect: Mood normal.        Behavior: Behavior normal.        Thought Content: Thought content normal.        Judgment: Judgment normal.          No results found for any visits on 09/15/23.    The 10-year ASCVD risk score (Arnett DK, et al., 2019) is: 4.5%    Assessment & Plan:  Fatty liver Assessment & Plan: 06/09/2023 labs LFTs are normal.  Will check  an ultrasound of her liver.  Orders: -     US  ABDOMEN LIMITED RUQ (LIVER/GB); Future  Primary hypertension -     hydroCHLOROthiazide; Take 1 tablet (12.5 mg total) by mouth daily.  Dispense: 30 tablet; Refill: 3  Benign essential hypertension Assessment & Plan: She had taken blood pressure medication in the past but chart notes that she has to be taken off of it.  She expressed concerns today that she can get addicted to blood pressure medicine explained that that is not possible.  Explained the risk for stroke and heart attack.  Start HCTZ 12.5 mg daily.  Please check your blood pressure at home follow-up in a month.   Assessment and Plan    Hypertension Blood pressure is 175/87, repeat BP reading 147/81, indicating hypertension. She does not monitor blood pressure  at home and is not on antihypertensive medication. Possible situational elevation due to stress from starting a new job. Discussed the importance of home monitoring and potential need for medication. Addressed concerns about medication dependency, explaining that antihypertensive medications do not cause addiction but should not be stopped without medical advice. - Recommend purchasing an upper arm blood pressure cuff for home monitoring - Prescribe hydrochlorothiazide  12.5mg  for blood pressure management - Educate on the importance of consistent medication use and monitoring  Fatty Liver Disease Fatty liver disease identified in previous evaluations at [facility]. Emphasized weight loss as a primary intervention for management, acknowledging the difficulty of weight loss. - Order liver ultrasound to assess current status - Discuss weight loss strategies  Menopausal Symptoms Reports decreased libido, likely related to menopause and estrogen loss. Explained physiological changes during menopause affecting libido. Suggested that her husband may need to be more proactive in initiating intimacy. - Provide education on menopausal symptoms and their impact on libido         Return in about 4 weeks (around 10/13/2023).    Jawaun Celmer K Braniya Farrugia, MD

## 2023-09-29 ENCOUNTER — Ambulatory Visit
Admission: RE | Admit: 2023-09-29 | Discharge: 2023-09-29 | Disposition: A | Discharge status: Home / Self Care | Source: Ambulatory Visit | Attending: Family Medicine | Admitting: Family Medicine

## 2023-09-29 DIAGNOSIS — K760 Fatty (change of) liver, not elsewhere classified: Secondary | ICD-10-CM | POA: Diagnosis present

## 2023-10-05 ENCOUNTER — Ambulatory Visit: Payer: Self-pay | Admitting: Family Medicine

## 2023-11-15 ENCOUNTER — Ambulatory Visit (INDEPENDENT_AMBULATORY_CARE_PROVIDER_SITE_OTHER): Admitting: Family Medicine

## 2023-11-15 ENCOUNTER — Encounter: Payer: Self-pay | Admitting: Family Medicine

## 2023-11-15 VITALS — BP 151/85 | HR 75 | Temp 98.1°F | Resp 18 | Ht <= 58 in | Wt 173.0 lb

## 2023-11-15 DIAGNOSIS — R7303 Prediabetes: Secondary | ICD-10-CM | POA: Diagnosis not present

## 2023-11-15 DIAGNOSIS — I10 Essential (primary) hypertension: Secondary | ICD-10-CM | POA: Diagnosis not present

## 2023-11-15 LAB — POCT GLYCOSYLATED HEMOGLOBIN (HGB A1C): Hemoglobin A1C: 5.7 % — AB (ref 4.0–5.6)

## 2023-11-15 MED ORDER — AMLODIPINE BESYLATE 5 MG PO TABS: 5.0000 mg | ORAL_TABLET | Freq: Every day | ORAL | 1 refills | Status: DC

## 2023-11-15 NOTE — Assessment & Plan Note (Signed)
 A1c today is 5.7%.  She is diet and exercise controlled only.

## 2023-11-15 NOTE — Progress Notes (Signed)
 Established Patient Office Visit  Subjective   Patient ID: Makayla Baker, female    DOB: 10-19-1965  Age: 58 y.o. MRN: [id number]  Chief Complaint  Patient presents with   Medical Management of Chronic Issues   Breast Pain    Left side    HPI 58 yo delightful woman with borderline diabetes, Hx anemia, anxiety/depression, asthma, vitamin B12 and D deficiencies, tension headaches, trigeminal neuralgia, mixed hyperlipidemia, OSA (not on CPAP) and HTN, colonoscopy 12/15/2015, DEXA 07/12/2023 (FN -0.5, L -0.3).  She has been taking HCTZ 12.5 mg daily for her blood pressure.  She reports that she has had a bloody nose every morning for the last 3 weeks.  She drinks 1 bottle of water a day only.  She had no difficulty taking her blood pressure medicine.  She did not check her blood pressure at home because she does not have a use the cuff.  Asked her to bring the cuff up here and we will teach her how to use it.  Sometimes she feels like her heart beats fast for 15 minutes or so after she takes her blood pressure medicine.  Blood pressure 147/77 followed by 151/85.    We repeated her liver ultrasound and she has echogenicity that is typical for fatty liver and she has a mildly nodular surface.    Objective:     BP (!) 151/85 (BP Location: Left Arm, Patient Position: Sitting, Cuff Size: Normal)   Pulse 75   Temp 98.1 F (36.7 C) (Oral)   Resp 18   Ht 4' 10 (1.473 m)   Wt 173 lb (78.5 kg)   LMP 11/25/2014 (Approximate)   SpO2 97%   BMI 36.16 kg/m    Physical Exam Vitals and nursing note reviewed.  Constitutional:      Appearance: Normal appearance.  HENT:     Head: Normocephalic and atraumatic.     Right Ear: Tympanic membrane, ear canal and external ear normal.     Left Ear: Tympanic membrane, ear canal and external ear normal.  Eyes:     Conjunctiva/sclera: Conjunctivae normal.  Cardiovascular:     Rate and Rhythm: Normal rate and regular rhythm.  Pulmonary:      Effort: Pulmonary effort is normal.     Breath sounds: Normal breath sounds.  Musculoskeletal:     Right lower leg: No edema.     Left lower leg: No edema.  Skin:    General: Skin is warm and dry.  Neurological:     Mental Status: She is alert and oriented to person, place, and time.  Psychiatric:        Mood and Affect: Mood normal.        Behavior: Behavior normal.        Thought Content: Thought content normal.        Judgment: Judgment normal.          Results for orders placed or performed in visit on 11/15/23  POCT glycosylated hemoglobin (Hb A1C)  Result Value Ref Range   Hemoglobin A1C 5.7 (A) 4.0 - 5.6 %   HbA1c POC (<> result, manual entry)     HbA1c, POC (prediabetic range)     HbA1c, POC (controlled diabetic range)        The 10-year ASCVD risk score (Arnett DK, et al., 2019) is: 5.2%    Assessment & Plan:  Prediabetes Assessment & Plan: A1c today is 5.7%.  She is diet and exercise controlled only.  Orders: -  POCT glycosylated hemoglobin (Hb A1C)  Benign essential hypertension Assessment & Plan: Having a dry nose probably from HCTZ 12.5 mg daily and not drinking much water.  Will change her blood pressure medicine to amlodipine  5 mg daily.  Follow-up in a month to recheck your blood pressure.  Please bring your blood pressure cuff up here and we will teach you how to use it.  Orders: -     amLODIPine  Besylate; Take 1 tablet (5 mg total) by mouth daily.  Dispense: 90 tablet; Refill: 1 -     Basic metabolic panel with GFR     Return in about 4 weeks (around 12/13/2023).    Judeen Geralds K Maral Lampe, MD

## 2023-11-15 NOTE — Assessment & Plan Note (Signed)
 Having a dry nose probably from HCTZ 12.5 mg daily and not drinking much water.  Will change her blood pressure medicine to amlodipine  5 mg daily.  Follow-up in a month to recheck your blood pressure.  Please bring your blood pressure cuff up here and we will teach you how to use it.

## 2023-11-16 ENCOUNTER — Ambulatory Visit: Payer: Self-pay | Admitting: Family Medicine

## 2023-11-16 LAB — BASIC METABOLIC PANEL WITH GFR
BUN/Creatinine Ratio: 41 — ABNORMAL HIGH (ref 9–23)
BUN: 22 mg/dL (ref 6–24)
CO2: 24 mmol/L (ref 20–29)
Calcium: 9.2 mg/dL (ref 8.7–10.2)
Chloride: 104 mmol/L (ref 96–106)
Creatinine, Ser: 0.54 mg/dL — ABNORMAL LOW (ref 0.57–1.00)
Glucose: 77 mg/dL (ref 70–99)
Potassium: 3.8 mmol/L (ref 3.5–5.2)
Sodium: 141 mmol/L (ref 134–144)
eGFR: 106 mL/min/1.73 (ref >59)

## 2023-11-17 ENCOUNTER — Telehealth: Payer: Self-pay | Admitting: Family Medicine

## 2023-11-17 ENCOUNTER — Ambulatory Visit: Payer: Self-pay

## 2023-11-17 NOTE — Telephone Encounter (Signed)
 [employer] [place] #[id number] utilized to triage patient.   FYI Only or Action Required?: Action required by provider: clinical question for provider.  Patient was last seen in primary care on 11/15/2023 by Ziglar, Susan K, MD.  Called Nurse Triage reporting Medication Reaction.  Symptoms began yesterday.  Interventions attempted: Rest, hydration, or home remedies.  Symptoms are: completely resolved.  Triage Disposition: Discuss With PCP and Callback by Nurse Today (overriding Call PCP When Office is Open)  Patient/caregiver understands and will follow disposition?: Yes  Reason for Disposition  [1] Caller has NON-URGENT medicine question about med that PCP prescribed AND [2] triager unable to answer question  Answer Assessment - Initial Assessment Questions 1. NAME of MEDICINE: What medicine(s) are you calling about?     Amlodipine   Patient reports new onset headaches since starting Amlodipine  on 11/16/23. Also states she felt like she was going to fall while at work, now has resolved. Reports that headache has also resolved. States she only took one dose yesterday, has not taken today.   Denies any symptoms at this time. Patient states that she does not check her blood pressure at home.   This RN advised that this medication can sometimes cause a headache when first starting it, but that a message will be sent to PCP to ask if she should continue or discontinue the amlodipine . Patient states that she will not take the amlodipine  even if directed to, would like to try a different medication.  Protocols used: Medication Question Call-A-AH

## 2023-11-17 NOTE — Telephone Encounter (Signed)
 Call disconnected prior to transfer; attempted call back x1; left vm.   Copied from CRM [id number]. Topic: Clinical - Red Word Triage >> Nov 17, 2023 11:04 AM Tiffini S wrote: [facility] that prompted transfer to Nurse Triage: Patient was prescribed amLODipine  (NORVASC ) 5 MG tablet- said she is having a headaches, wants to know what she should do Transferred to triage nurse.

## 2023-11-17 NOTE — Telephone Encounter (Signed)
 Patient states she started taking Amlodipine  yesterday and a hour later started having a headache of 8/10. The headache lasted until 2 am this morning. Patient has not taken medication today and would like advisement on whether or not to continue.

## 2023-11-17 NOTE — Telephone Encounter (Signed)
 Attempted to reach using Interp Line- No answer. Will have PM CMA try again.

## 2023-11-17 NOTE — Telephone Encounter (Addendum)
 1st attempt - [employer] [place] [phone] utilized to leave a voicemail for patient  ----------------------------------- Copied from CRM [id number]. Topic: Clinical - Prescription Issue >> Nov 17, 2023  8:11 AM Makayla Baker wrote: Reason for CRM: amLODipine  (NORVASC ) 5 MG tablet  Is causing her headaches, wants to know what she should do?   Best contact: [phone]

## 2023-11-17 NOTE — Telephone Encounter (Signed)
 Asked her to hold the amlodipine  for now.  Lease make an appointment with the nurse and learn how to use the blood pressure cuff.

## 2023-11-17 NOTE — Telephone Encounter (Signed)
 2nd attempt - [employer] [place] [phone] utilized to leave a voicemail for patient

## 2023-11-20 ENCOUNTER — Ambulatory Visit (INDEPENDENT_AMBULATORY_CARE_PROVIDER_SITE_OTHER)

## 2023-11-20 VITALS — BP 137/84

## 2023-11-20 DIAGNOSIS — I10 Essential (primary) hypertension: Secondary | ICD-10-CM | POA: Diagnosis not present

## 2023-11-27 ENCOUNTER — Ambulatory Visit

## 2023-12-13 ENCOUNTER — Ambulatory Visit: Admitting: Family Medicine

## 2023-12-13 ENCOUNTER — Encounter: Payer: Self-pay | Admitting: Family Medicine

## 2023-12-13 VITALS — BP 137/81 | HR 61 | Temp 98.2°F | Resp 18 | Ht <= 58 in | Wt 170.0 lb

## 2023-12-13 DIAGNOSIS — Z23 Encounter for immunization: Secondary | ICD-10-CM

## 2023-12-13 DIAGNOSIS — I10 Essential (primary) hypertension: Secondary | ICD-10-CM

## 2023-12-13 NOTE — Progress Notes (Signed)
   Established Patient Office Visit  Subjective   Patient ID: Makayla Baker, female    DOB: 1965/11/11  Age: 58 y.o. MRN: [id number]  Chief Complaint  Patient presents with   Medical Management of Chronic Issues    HPI Delightful 58 year old woman with borderline diabetes, Hx anemia, anxiety/depression, asthma, vitamin B12 and D deficiencies, tension headaches, trigeminal neuralgia, mixed hyperlipidemia, OSA (not on CPAP) and HTN, colonoscopy 12/15/2015, DEXA 07/12/2023 (FN equals -0.5, L equals -0.3).  She was taking HCTZ 12.5 mg for her blood pressure and developed a bloody nose for 3 weeks.  Turns out she drinks 1 bottle of water a day only.  Switched her to amlodipine  5 mg daily.  She took 1 amlodipine  and had a bitemporal headache that lasted all day.  She stopped taking the amlodipine  and checked her blood pressure at home a couple of times.  With an above the elbow fully automatic cuff she got 138/87 and 155/93.   ROS    Objective:     BP 137/81 (BP Location: Left Arm, Patient Position: Sitting, Cuff Size: Normal)   Pulse 61   Temp 98.2 F (36.8 C) (Oral)   Resp 18   Ht 4' 10 (1.473 m)   Wt 170 lb (77.1 kg)   LMP 11/25/2014 (Approximate)   SpO2 95%   BMI 35.53 kg/m    Physical Exam Vitals and nursing note reviewed.  Constitutional:      Appearance: Normal appearance.  HENT:     Head: Normocephalic and atraumatic.  Eyes:     Conjunctiva/sclera: Conjunctivae normal.  Cardiovascular:     Rate and Rhythm: Normal rate and regular rhythm.  Pulmonary:     Effort: Pulmonary effort is normal.     Breath sounds: Normal breath sounds.  Musculoskeletal:     Right lower leg: No edema.     Left lower leg: No edema.  Skin:    General: Skin is warm and dry.  Neurological:     Mental Status: She is alert and oriented to person, place, and time.  Psychiatric:        Mood and Affect: Mood normal.        Behavior: Behavior normal.        Thought Content: Thought  content normal.        Judgment: Judgment normal.          No results found for any visits on 12/13/23.    The 10-year ASCVD risk score (Arnett DK, et al., 2019) is: 4.3%    Assessment & Plan:  Immunization due -     Flu vaccine trivalent PF, 6mos and older(Flulaval,Afluria,Fluarix,Fluzone)  Benign essential hypertension Assessment & Plan: .  Please try amlodipine  again.  Please check your blood pressure with a fully automatic above the elbow cuff.  Follow-up in the office in a month.                   Return in about 4 weeks (around 01/10/2024).    Chene Kasinger K Alixandria Friedt, MD

## 2023-12-13 NOTE — Assessment & Plan Note (Signed)
.    Please try amlodipine  again.  Please check your blood pressure with a fully automatic above the elbow cuff.  Follow-up in the office in a month.

## 2024-01-09 ENCOUNTER — Other Ambulatory Visit: Payer: Self-pay | Admitting: Family Medicine

## 2024-01-09 DIAGNOSIS — I10 Essential (primary) hypertension: Secondary | ICD-10-CM

## 2024-01-12 ENCOUNTER — Encounter: Payer: Self-pay | Admitting: Family Medicine

## 2024-01-12 ENCOUNTER — Ambulatory Visit (INDEPENDENT_AMBULATORY_CARE_PROVIDER_SITE_OTHER): Admitting: Family Medicine

## 2024-01-12 VITALS — BP 137/64 | HR 71 | Temp 98.3°F | Resp 18 | Ht <= 58 in | Wt 169.0 lb

## 2024-01-12 DIAGNOSIS — R030 Elevated blood-pressure reading, without diagnosis of hypertension: Secondary | ICD-10-CM

## 2024-01-14 DIAGNOSIS — R030 Elevated blood-pressure reading, without diagnosis of hypertension: Secondary | ICD-10-CM | POA: Insufficient documentation

## 2024-01-14 NOTE — Assessment & Plan Note (Signed)
 She has had normal blood pressure readings over the last month even at the dentist office.  She is getting normal readings in the office today.  She is drinking more water and she has not been taking Sudafed any longer.  Will continue to watch her blood pressure but it normal.

## 2024-01-14 NOTE — Progress Notes (Signed)
   Established Patient Office Visit  Subjective   Patient ID: Makayla Baker, female    DOB: [birth year]  Age: 58 y.o. MRN: [id number]  Chief Complaint  Patient presents with   Medical Management of Chronic Issues   Bitter taste in mouth    HPIDelightful 58 yo woman with borderline diabetes, Hx anemia, anxiety/depression, asthma, vitamin B12 and D deficiencies, tension headaches, trigeminal neuralgia, mixed hyperlipidemia, OSA (not on CPAP) and elevated blood pressure, colonoscopy 12/15/2015, Cologuard 11/05/2022 negative, DEXA 07/12/2023 (FN equals -0.5, L equals -0.3).   She has been checking her blood pressure at home and all of her readings have been in the 130s over 60s-70s.  She has been drinking 3 bottles of water a day now.  She has not had any more nosebleeds.  She is not taking any blood pressure medication since she was last here.  She does occasionally take loratadine with pseudoephedrine.  Advised the pseudoephedrine will run her blood pressure up.  She went to the dentist and her blood pressure was 136/80 on 12/31/2023.  Her blood pressure in the office today is 137/64.     Objective:     BP 137/64 (BP Location: Left Arm, Patient Position: Sitting, Cuff Size: Normal)   Pulse 71   Temp 98.3 F (36.8 C) (Oral)   Resp 18   Ht 4' 10 (1.473 m)   Wt 169 lb (76.7 kg)   LMP 11/25/2014 (Approximate)   SpO2 96%   BMI 35.32 kg/m    Physical Exam Vitals and nursing note reviewed.  Constitutional:      Appearance: Normal appearance.  HENT:     Head: Normocephalic and atraumatic.  Eyes:     Conjunctiva/sclera: Conjunctivae normal.  Cardiovascular:     Rate and Rhythm: Normal rate and regular rhythm.  Pulmonary:     Effort: Pulmonary effort is normal.     Breath sounds: Normal breath sounds.  Musculoskeletal:     Right lower leg: No edema.     Left lower leg: No edema.  Skin:    General: Skin is warm and dry.  Neurological:     Mental Status: She is alert and  oriented to person, place, and time.  Psychiatric:        Mood and Affect: Mood normal.        Behavior: Behavior normal.        Thought Content: Thought content normal.        Judgment: Judgment normal.          No results found for any visits on 01/12/24.    The 10-year ASCVD risk score (Arnett DK, et al., 2019) is: 3.2%    Assessment & Plan:  Elevated blood pressure reading Assessment & Plan: She has had normal blood pressure readings over the last month even at the dentist office.  She is getting normal readings in the office today.  She is drinking more water and she has not been taking Sudafed any longer.  Will continue to watch her blood pressure but it normal.        Return in about 3 months (around 04/13/2024).    Anahis Furgeson K Letty Salvi, MD

## 2024-01-23 ENCOUNTER — Other Ambulatory Visit: Payer: Self-pay | Admitting: Family Medicine

## 2024-01-23 NOTE — Telephone Encounter (Unsigned)
 Copied from CRM #[id number]. Topic: Clinical - Medication Refill >> Jan 23, 2024  4:14 PM Delon T wrote: Medication: ergocalciferol  (VITAMIN D2) 50000 UNITS capsule  Has the patient contacted their pharmacy? Yes (Agent: If no, request that the patient contact the pharmacy for the refill. If patient does not wish to contact the pharmacy document the reason why and proceed with request.) (Agent: If yes, when and what did the pharmacy advise?)  This is the patient's preferred pharmacy:  [facility] [address], [place], [place] [address] Open  Closes 10 PM  More hours [phone]  Is this the correct pharmacy for this prescription? Yes If no, delete pharmacy and type the correct one.   Has the prescription been filled recently? Yes  Is the patient out of the medication? No  Has the patient been seen for an appointment in the last year OR does the patient have an upcoming appointment? Yes  Can we respond through MyChart? Yes  Agent: Please be advised that Rx refills may take up to 3 business days. We ask that you follow-up with your pharmacy.

## 2024-01-24 MED ORDER — ERGOCALCIFEROL 1.25 MG (50000 UT) PO CAPS: 50000.0000 [IU] | ORAL_CAPSULE | ORAL | 1 refills | Status: AC

## 2024-03-03 ENCOUNTER — Ambulatory Visit
Admission: EM | Admit: 2024-03-03 | Discharge: 2024-03-03 | Disposition: A | Discharge status: Home / Self Care | Attending: Emergency Medicine | Admitting: Emergency Medicine

## 2024-03-03 ENCOUNTER — Encounter: Payer: Self-pay | Admitting: Emergency Medicine

## 2024-03-03 DIAGNOSIS — Z8679 Personal history of other diseases of the circulatory system: Secondary | ICD-10-CM | POA: Diagnosis not present

## 2024-03-03 DIAGNOSIS — I10 Essential (primary) hypertension: Secondary | ICD-10-CM

## 2024-03-03 NOTE — ED Provider Notes (Signed)
 HPI  SUBJECTIVE:  Makayla Baker is a 59 y.o. female who presents with several elevated blood pressure readings starting yesterday.  States that the highest was 185/103 measured at a pharmacy. she measures her blood pressure on a regular basis at home and states that it was normal last week.  She denies dietary changes, increased sodium intake, and is still watching her sugar intake.  She states that she has been under an increased amount of stress recently.  She reports a mild posterior headache, but this is not the worst headache that she has ever had.  She took a dose of hydrochlorothiazide  12.5 mg last night and measured her blood pressure 153/92 this morning.  She thinks this may have made a difference, but is not sure.  No aggravating factors.  Patient was last seen by her PCP on 11/14 , where her blood pressure was normal and it was noted that she was significantly improving with lifestyle changes.  Hydrochlorothiazide  12.5 mg daily was discontinued at that visit.     She has a past medical history of anemia, asthma, tension headaches, trigeminal neuralgia, prediabetes, mixed hyperlipidemia, OSA not on CPAP-states that she cannot sleep with it, hypertension. PCP: [facility]  Past Medical History:  Diagnosis Date   Complication of anesthesia    Depression    Headache    Hip pain, chronic    History of orthopnea    Hypertension    H/O NO MEDS NOW   PONV (postoperative nausea and vomiting)    no trouble with cataract surgery   Sleep apnea    NO CPAP    Past Surgical History:  Procedure Laterality Date   CATARACT EXTRACTION W/PHACO Right 04/04/2017   Procedure: CATARACT EXTRACTION PHACO AND INTRAOCULAR LENS PLACEMENT (IOC);  Surgeon: Jaye Fallow, MD;  Location: [facility] ORS;  Service: Ophthalmology;  Laterality: Right;  US  00:30.0 AP% 7.8 CDE 2.33  Fluid Pack Lot # [id number]   CATARACT EXTRACTION W/PHACO Left 04/18/2017   Procedure: CATARACT EXTRACTION  PHACO AND INTRAOCULAR LENS PLACEMENT (IOC);  Surgeon: Jaye Fallow, MD;  Location: [facility] ORS;  Service: Ophthalmology;  Laterality: Left;  US  00:18.6 AP% 7.5 CDE 1.39 Fluid Pack Lot # [id number]   NASAL SINUS SURGERY      Family History  Problem Relation Age of Onset   Breast cancer Neg Hx     Social History[1]  Current Medications[2]  Allergies[3]   ROS  As noted in HPI.   Physical Exam  BP 133/73 (BP Location: Right Arm)   Pulse 83   Temp 98.5 F (36.9 C) (Oral)   Resp 15   Ht 4' 10 (1.473 m)   Wt 79.8 kg   LMP 11/25/2014   SpO2 95%   BMI 36.78 kg/m   Constitutional: Well developed, well nourished, no acute distress Eyes:  EOMI, conjunctiva normal bilaterally HENT: Normocephalic, atraumatic,mucus membranes moist Respiratory: Normal inspiratory effort, lungs clear bilaterally Cardiovascular: Normal rate, regular rhythm no murmurs rubs or gallops GI: nondistended skin: No rash, skin intact Musculoskeletal: no deformities, no edema Neurologic: Alert & oriented x 3, no focal neuro deficits.  Speech fluent.  Cranial nerves III through XII grossly intact Psychiatric: Speech and behavior appropriate   ED Course   Medications - No data to display  No orders of the defined types were placed in this encounter.   No results found for this or any previous visit (from the past 24 hours). No results found.  ED Clinical Impression  1. History of hypertension   2. Elevated blood pressure reading with diagnosis of hypertension      ED Assessment/Plan    Outside records reviewed.  Additional medical history obtained.  As noted in HPI.  Patient's blood pressure is normal here.  Could be leftover from the hydrochlorothiazide  she took yesterday.  She is currently asymptomatic.  Has no physical signs of end organ damage.  Advised her to measure her blood pressure once a day, preferably at the same time every day using her arm cuff.  She states she will call her  primary care provider tomorrow.  She can take a dose of her hydrochlorothiazide  if her blood pressure becomes significantly elevated and she becomes concerned, but she is to go to the ER for any signs or symptoms of a hypertensive emergency.  Provided patient with hypertensive emergency signs and symptoms to look for.  Discussed MDM, treatment plan, and plan for follow-up with patient. Discussed sn/sx that should prompt return to the ED. patient agrees with plan.   No orders of the defined types were placed in this encounter.     *This clinic note was created using Dragon dictation software. Therefore, there may be occasional mistakes despite careful proofreading.  ?     [1]  Social History Tobacco Use   Smoking status: Never    Passive exposure: Never   Smokeless tobacco: Never  Vaping Use   Vaping status: Never Used  Substance Use Topics   Alcohol use: No   Drug use: Never  [2] No current facility-administered medications for this encounter.  Current Outpatient Medications:    Ascorbic Acid (VITAMIN C) 1000 MG tablet, Take 1,000 mg by mouth daily., Disp: , Rfl:    azelastine (ASTELIN) 0.1 % nasal spray, Place 1 spray into both nostrils 2 (two) times daily., Disp: , Rfl:    ergocalciferol  (DRISDOL ) 1.25 MG (50000 UT) capsule, Take 1 capsule (50,000 Units total) by mouth once a week., Disp: 13 capsule, Rfl: 1   ketotifen (ZADITOR) 0.035 % ophthalmic solution, Place 1 drop into both eyes in the morning and at bedtime., Disp: , Rfl:    levocetirizine (XYZAL) 5 MG tablet, Take 5 mg by mouth every evening., Disp: , Rfl:    loratadine-pseudoephedrine (CLARITIN-D 12-HOUR) 5-120 MG tablet, Take 1 tablet by mouth 2 (two) times daily., Disp: , Rfl:    meclizine  (ANTIVERT ) 25 MG tablet, Take 1 tablet (25 mg total) by mouth 3 (three) times daily as needed for dizziness., Disp: 30 tablet, Rfl: 0   tiZANidine (ZANAFLEX) 2 MG tablet, Take 2 mg by mouth 2 (two) times daily., Disp: , Rfl:  [3]   Allergies Allergen Reactions   Dexamethasone Other (See Comments)    Pt unsure   Hydrocodone-Acetaminophen  Other (See Comments)    Pt unsure     Van Knee, MD 03/04/24 1003

## 2024-03-03 NOTE — ED Triage Notes (Signed)
 Patient states that her BP was elevated yesterday at 176/93.  Patient was on previously on BP medicine but her PCP took her off her BP medicine back in September.  Patient states that did take hydrochlorothiazide  around 6:30pm last night.  Patient reports ongoing pain in the back of her head.

## 2024-03-03 NOTE — Discharge Instructions (Addendum)
 Decrease your salt intake. diet and exercise will lower your blood pressure significantly. It is important to keep your blood pressure under good control, as having a elevated blood pressure for prolonged periods of time significantly increases your risk of stroke, heart attacks, kidney damage, eye damage, and other problems. Get a validated blood pressure cuff that goes on your arm, not your wrist.  Measure your blood pressure once a day, preferably at the same time every day. Keep a log of this and bring it to your next doctor's appointment.  Bring your blood pressure cuff as well.    Call your primary care provider tomorrow to see what they would like for you to do if your blood pressure is elevated.  I would also like for you to revisit the fact that your CPAP mask does not work well for you with your doctor.  You could be snoring and that is elevating your blood pressure.  Call 911/return immediately to the ER if you start having chest pain, worst headache of your life, problems seeing, problems talking, problems walking, if you feel like you're about to pass out, if you do pass out, if you have a seizure, or for any other concerns.  Go to www.goodrx.com  or www.costplusdrugs.com to look up your medications. This will give you a list of where you can find your prescriptions at the most affordable prices. Or ask the pharmacist what the cash price is, or if they have any other discount programs available to help make your medication more affordable. This can be less expensive than what you would pay with insurance.
